# Patient Record
Sex: Male | Born: 1980
Health system: Southern US, Community
[De-identification: ages and names within clinical notes are randomized; demographics above are authoritative.]

## PROBLEM LIST (undated history)

## (undated) DIAGNOSIS — I10 Essential (primary) hypertension: Secondary | ICD-10-CM

## (undated) DIAGNOSIS — E119 Type 2 diabetes mellitus without complications: Secondary | ICD-10-CM

## (undated) HISTORY — DX: Essential (primary) hypertension: I10

## (undated) HISTORY — DX: Type 2 diabetes mellitus without complications: E11.9

---

## 2009-06-14 ENCOUNTER — Emergency Department: Payer: Self-pay | Admitting: Emergency Medicine

## 2010-06-10 ENCOUNTER — Emergency Department: Payer: Self-pay | Admitting: Emergency Medicine

## 2010-08-10 ENCOUNTER — Emergency Department: Payer: Self-pay | Admitting: Emergency Medicine

## 2012-01-08 IMAGING — CR DG ABDOMEN 1V
1 series · 1 of 1 positions shown · non-contrast
Comparison: none

REASON FOR EXAM: diarrhea / 2 days odorous belechimg
COMMENTS:

PROCEDURE:     DXR - DXR KIDNEY URETER BLADDER  - August 10, 2010  [DATE]
RESULT:     Comparisons:  None

[view not recorded]
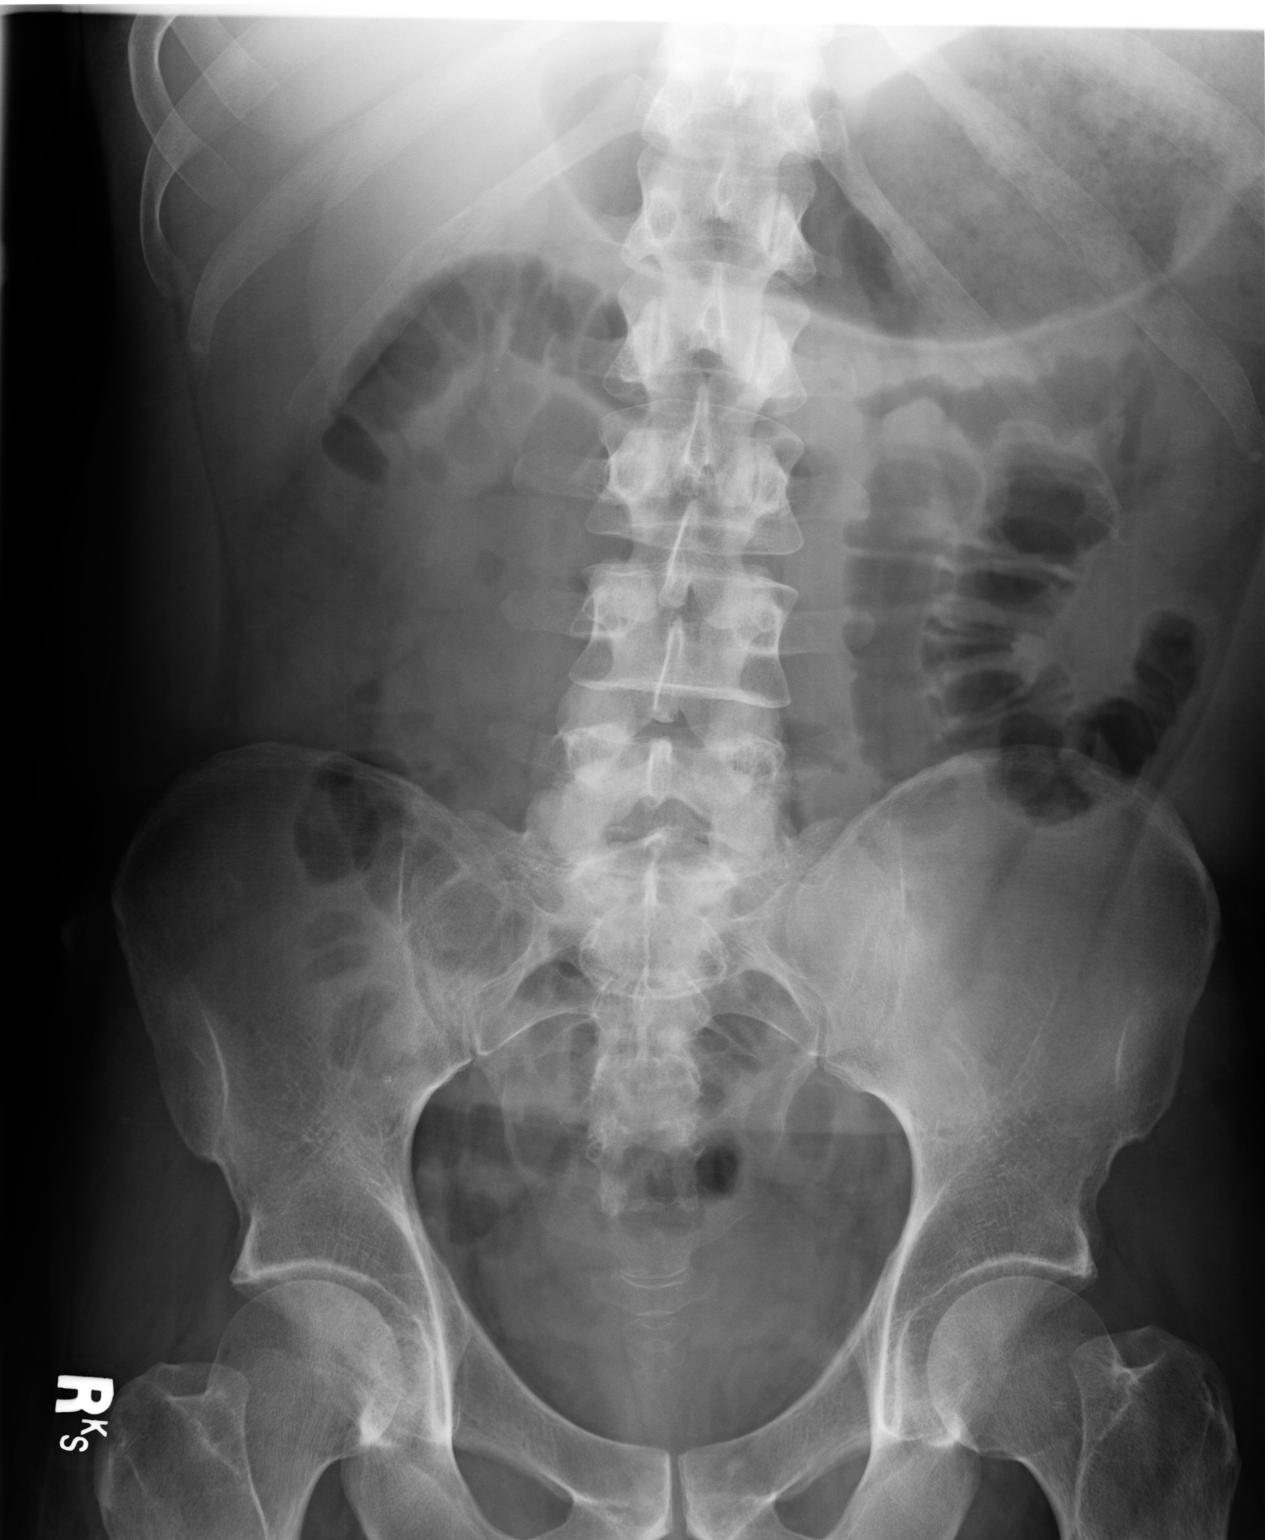

[1 of 1 positions shown; findings below may reference images not displayed]

FINDINGS: Supine radiograph of the abdomen is provided.

There is a nonspecific bowel gas pattern. There is gaseous distention of the
stomach. There is no bowel dilatation to suggest obstruction. There is no
pathologic calcification along the expected course of the ureters. There is
no evidence of pneumoperitoneum, portal venous gas, or pneumatosis.

The osseous structures are unremarkable.
IMPRESSION: Unremarkable KUB.

## 2014-04-12 ENCOUNTER — Emergency Department: Payer: Self-pay | Admitting: Emergency Medicine

## 2015-11-27 ENCOUNTER — Ambulatory Visit (INDEPENDENT_AMBULATORY_CARE_PROVIDER_SITE_OTHER): Payer: Managed Care, Other (non HMO) | Admitting: Internal Medicine

## 2015-11-27 ENCOUNTER — Encounter: Payer: Self-pay | Admitting: Internal Medicine

## 2015-11-27 VITALS — BP 120/78 | HR 76 | Temp 98.8°F | Ht 65.75 in | Wt 151.5 lb

## 2015-11-27 DIAGNOSIS — R140 Abdominal distension (gaseous): Secondary | ICD-10-CM

## 2015-11-27 DIAGNOSIS — R195 Other fecal abnormalities: Secondary | ICD-10-CM

## 2015-11-27 DIAGNOSIS — E119 Type 2 diabetes mellitus without complications: Secondary | ICD-10-CM | POA: Diagnosis not present

## 2015-11-27 NOTE — Patient Instructions (Signed)

## 2015-11-27 NOTE — Progress Notes (Signed)
HPI  Pt presents to the clinic today to establish care and for management of the conditions listed below. He has not had a PCP in many years.  DM 2: Diagnosed about 6 years ago. Initially started on Metformin but could not take it secondary to GI upset. Tried to maintain a low carb diet. Has not had A1C checked since that time. He denies blurred vision, increased thirst, urinary frequency, and numbness and tingling in the hands or feet.  He also c/o gassiness and loose stool. He reports this has been going on for a year. It seems worse right after he eats. He also reports excessive sweating with eating. He can not attribute this to certain foods. He takes Imodium as needed for this. He denies any family history of GI issues, IBS.  Flu: never Tetanus: > 10 years Dentist: as needed  Past Medical History:  Diagnosis Date  . Diabetes mellitus without complication (HCC)     No current outpatient prescriptions on file.   No current facility-administered medications for this visit.     No Known Allergies  Family History  Problem Relation Age of Onset  . Thyroid disease Mother   . Alcohol abuse Father   . Drug abuse Father   . Schizophrenia Father   . Depression Father     Social History   Social History  . Marital status: Single    Spouse name: N/A  . Number of children: N/A  . Years of education: N/A   Occupational History  . Not on file.   Social History Main Topics  . Smoking status: Never Smoker  . Smokeless tobacco: Never Used  . Alcohol use Yes     Comment: rare  . Drug use: No  . Sexual activity: Not on file   Other Topics Concern  . Not on file   Social History Narrative  . No narrative on file    ROS:  Constitutional: Denies fever, malaise, fatigue, headache or abrupt weight changes.  HEENT: Denies eye pain, eye redness, ear pain, ringing in the ears, wax buildup, runny nose, nasal congestion, bloody nose, or sore throat. Respiratory: Denies difficulty  breathing, shortness of breath, cough or sputum production.   Cardiovascular: Denies chest pain, chest tightness, palpitations or swelling in the hands or feet.  Gastrointestinal: Pt reports gassiness and loose stool. Denies abdominal pain, bloating, constipation, or blood in the stool.  GU: Denies frequency, urgency, pain with urination, blood in urine, odor or discharge. Musculoskeletal: Denies decrease in range of motion, difficulty with gait, muscle pain or joint pain and swelling.  Skin: Denies redness, rashes, lesions or ulcercations.  Neurological: Denies dizziness, difficulty with memory, difficulty with speech or problems with balance and coordination.  Psych: Denies anxiety, depression, SI/HI.  No other specific complaints in a complete review of systems (except as listed in HPI above).  PE:  BP 120/78   Pulse 76   Temp 98.8 F (37.1 C) (Oral)   Ht 5' 5.75" (1.67 m)   Wt 151 lb 8 oz (68.7 kg)   SpO2 98%   BMI 24.64 kg/m  Wt Readings from Last 3 Encounters:  11/27/15 151 lb 8 oz (68.7 kg)    General: Appears his stated age,  in NAD. Neck: Neck supple, trachea midline. No masses, lumps or thyromegaly present.  Cardiovascular: Normal rate and rhythm. S1,S2 noted.  No murmur, rubs or gallops noted.  Pulmonary/Chest: Normal effort and positive vesicular breath sounds. No respiratory distress. No wheezes, rales or  ronchi noted.  Abdomen: Soft and nontender. Normal bowel sounds. No distention or masses noted.  Neurological: Alert and oriented.  Psychiatric: Mood and affect normal. Behavior is normal. Judgment and thought content normal.     Assessment and Plan:  Gassiness and loose stool:  ? IBS vs food allergy Given family history of hyperthyroid, will check TSH CBC, CMET and H Pylori as well  Make an appt for your annual exam Nicki Reaper, NP

## 2015-11-28 LAB — CBC
HCT: 40.2 % (ref 39.0–52.0)
Hemoglobin: 13.9 g/dL (ref 13.0–17.0)
MCHC: 34.7 g/dL (ref 30.0–36.0)
MCV: 87.6 fl (ref 78.0–100.0)
Platelets: 218 10*3/uL (ref 150.0–400.0)
RBC: 4.59 Mil/uL (ref 4.22–5.81)
RDW: 12.7 % (ref 11.5–15.5)
WBC: 6.9 10*3/uL (ref 4.0–10.5)

## 2015-11-28 LAB — COMPREHENSIVE METABOLIC PANEL
ALBUMIN: 4.2 g/dL (ref 3.5–5.2)
ALK PHOS: 55 U/L (ref 39–117)
ALT: 21 U/L (ref 0–53)
AST: 16 U/L (ref 0–37)
BILIRUBIN TOTAL: 0.5 mg/dL (ref 0.2–1.2)
BUN: 15 mg/dL (ref 6–23)
CO2: 31 mEq/L (ref 19–32)
CREATININE: 0.84 mg/dL (ref 0.40–1.50)
Calcium: 9.4 mg/dL (ref 8.4–10.5)
Chloride: 103 mEq/L (ref 96–112)
GFR: 109.04 mL/min (ref >60.00)
GLUCOSE: 240 mg/dL — AB (ref 70–99)
POTASSIUM: 4.7 meq/L (ref 3.5–5.1)
SODIUM: 140 meq/L (ref 135–145)
TOTAL PROTEIN: 6.6 g/dL (ref 6.0–8.3)

## 2015-11-28 LAB — TSH: TSH: 1.52 u[IU]/mL (ref 0.35–4.50)

## 2015-11-28 LAB — HEMOGLOBIN A1C: Hgb A1c MFr Bld: 9.2 % — ABNORMAL HIGH (ref 4.6–6.5)

## 2015-11-28 LAB — H. PYLORI ANTIBODY, IGG: H Pylori IgG: NEGATIVE

## 2015-11-29 ENCOUNTER — Telehealth: Payer: Self-pay | Admitting: Internal Medicine

## 2015-11-29 MED ORDER — GLIPIZIDE 10 MG PO TABS: 10.0000 mg | ORAL_TABLET | Freq: Two times a day (BID) | ORAL | 3 refills | Status: DC

## 2015-11-29 NOTE — Telephone Encounter (Signed)
Sent in glipizide per lab note

## 2015-11-29 NOTE — Telephone Encounter (Signed)
See labs 

## 2015-11-29 NOTE — Telephone Encounter (Signed)
Patient called asking to get his lab results.

## 2015-11-29 NOTE — Addendum Note (Signed)
Addended by: Eual FinesBRIDGES, SHANNON P on: 11/29/2015 05:22 PM   Modules accepted: Orders

## 2015-12-06 ENCOUNTER — Ambulatory Visit (INDEPENDENT_AMBULATORY_CARE_PROVIDER_SITE_OTHER): Payer: Managed Care, Other (non HMO) | Admitting: Internal Medicine

## 2015-12-06 ENCOUNTER — Encounter: Payer: Self-pay | Admitting: Internal Medicine

## 2015-12-06 ENCOUNTER — Encounter: Payer: Managed Care, Other (non HMO) | Admitting: Internal Medicine

## 2015-12-06 ENCOUNTER — Encounter: Payer: Self-pay | Admitting: *Deleted

## 2015-12-06 VITALS — BP 122/70 | HR 95 | Temp 98.6°F | Ht 65.5 in | Wt 146.5 lb

## 2015-12-06 DIAGNOSIS — Z0000 Encounter for general adult medical examination without abnormal findings: Secondary | ICD-10-CM

## 2015-12-06 DIAGNOSIS — Z114 Encounter for screening for human immunodeficiency virus [HIV]: Secondary | ICD-10-CM

## 2015-12-06 DIAGNOSIS — R143 Flatulence: Secondary | ICD-10-CM | POA: Diagnosis not present

## 2015-12-06 DIAGNOSIS — IMO0001 Reserved for inherently not codable concepts without codable children: Secondary | ICD-10-CM

## 2015-12-06 DIAGNOSIS — Z23 Encounter for immunization: Secondary | ICD-10-CM | POA: Diagnosis not present

## 2015-12-06 DIAGNOSIS — R195 Other fecal abnormalities: Secondary | ICD-10-CM

## 2015-12-06 DIAGNOSIS — R140 Abdominal distension (gaseous): Secondary | ICD-10-CM | POA: Diagnosis not present

## 2015-12-06 DIAGNOSIS — Z7689 Persons encountering health services in other specified circumstances: Secondary | ICD-10-CM

## 2015-12-06 LAB — LIPID PANEL
CHOL/HDL RATIO: 5
Cholesterol: 140 mg/dL (ref 0–200)
HDL: 30.8 mg/dL — AB (ref >39.00)
LDL CALC: 95 mg/dL (ref 0–99)
NonHDL: 109.01
TRIGLYCERIDES: 72 mg/dL (ref 0.0–149.0)
VLDL: 14.4 mg/dL (ref 0.0–40.0)

## 2015-12-06 NOTE — Progress Notes (Signed)
Pre visit review using our clinic review tool, if applicable. No additional management support is needed unless otherwise documented below in the visit note. 

## 2015-12-06 NOTE — Patient Instructions (Signed)

## 2015-12-06 NOTE — Addendum Note (Signed)
Addended by: Shon MilletWATLINGTON, Rozlynn Lippold M on: 12/06/2015 02:26 PM   Modules accepted: Orders

## 2015-12-06 NOTE — Progress Notes (Signed)
Subjective:    Patient ID: Thomas Stephenson, male    DOB: [birth year], 35 y.o.   MRN: [id number]  HPI  Pt presents to the clinic today for his annual exam.  Flu: never Tetanus: > 10 years ago Dentist: as needed  Diet: He does eat meat. He consumes fruits and veggies daily, fried food occasionally. He does drink soda and water. Exercise: None  Review of Systems    Past Medical History:  Diagnosis Date  . Diabetes mellitus without complication (HCC)     Current Outpatient Prescriptions  Medication Sig Dispense Refill  . glipiZIDE (GLUCOTROL) 10 MG tablet Take 1 tablet (10 mg total) by mouth 2 (two) times daily before a meal. 60 tablet 3   No current facility-administered medications for this visit.     No Known Allergies  Family History  Problem Relation Age of Onset  . Thyroid disease Mother   . Alcohol abuse Father   . Drug abuse Father   . Schizophrenia Father   . Depression Father     Social History   Social History  . Marital status: Single    Spouse name: N/A  . Number of children: N/A  . Years of education: N/A   Occupational History  . Not on file.   Social History Main Topics  . Smoking status: Never Smoker  . Smokeless tobacco: Never Used  . Alcohol use Yes     Comment: rare  . Drug use: No  . Sexual activity: Yes   Other Topics Concern  . Not on file   Social History Narrative  . No narrative on file     Constitutional: Denies fever, malaise, fatigue, headache or abrupt weight changes.  HEENT: Denies eye pain, eye redness, ear pain, ringing in the ears, wax buildup, runny nose, nasal congestion, bloody nose, or sore throat. Respiratory: Denies difficulty breathing, shortness of breath, cough or sputum production.   Cardiovascular: Denies chest pain, chest tightness, palpitations or swelling in the hands or feet.  Gastrointestinal: Pt reports abdominal bloating and loose stools. Denies abdominal pain, constipation, or blood in the stool.    GU: Denies urgency, frequency, pain with urination, burning sensation, blood in urine, odor or discharge. Musculoskeletal: Denies decrease in range of motion, difficulty with gait, muscle pain or joint pain and swelling.  Skin: Denies redness, rashes, lesions or ulcercations.  Neurological: Denies dizziness, difficulty with memory, difficulty with speech or problems with balance and coordination.  Psych: Denies anxiety, depression, SI/HI.  No other specific complaints in a complete review of systems (except as listed in HPI above).      Objective:   Physical Exam  BP 122/70 (BP Location: Right Arm, Patient Position: Sitting, Cuff Size: Normal)   Pulse 95   Temp 98.6 F (37 C) (Oral)   Ht 5' 5.5" (1.664 m)   Wt 146 lb 8 oz (66.5 kg)   SpO2 97%   BMI 24.01 kg/m  Wt Readings from Last 3 Encounters:  12/06/15 146 lb 8 oz (66.5 kg)  11/27/15 151 lb 8 oz (68.7 kg)    General: Appears his stated age, well developed, well nourished in NAD. Skin: Warm, dry and intact.  HEENT: Head: normal shape and size; Eyes: sclera white, no icterus, conjunctiva pink, PERRLA and EOMs intact; Ears: Tm's gray and intact, normal light reflex; Throat/Mouth: Teeth present, mucosa pink and moist, no exudate, lesions or ulcerations noted.  Neck:  Neck supple, trachea midline. No masses, lumps or thyromegaly present.  Cardiovascular:  Normal rate and rhythm. S1,S2 noted.  No murmur, rubs or gallops noted. No JVD or BLE edema.  Pulmonary/Chest: Normal effort and positive vesicular breath sounds. No respiratory distress. No wheezes, rales or ronchi noted.  Abdomen: Soft and nontender. Normal bowel sounds. No distention or masses noted. Liver, spleen and kidneys non palpable. Musculoskeletal: Normal range of motion. Strength 5/5 BUE/BLE. No difficulty with gait.  Neurological: Alert and oriented. Cranial nerves II-XII grossly intact. Coordination normal.  Psychiatric: Mood and affect normal. Behavior is normal.  Judgment and thought content normal.     BMET    Component Value Date/Time   NA 140 11/27/2015 1537   K 4.7 11/27/2015 1537   CL 103 11/27/2015 1537   CO2 31 11/27/2015 1537   GLUCOSE 240 (H) 11/27/2015 1537   BUN 15 11/27/2015 1537   CREATININE 0.84 11/27/2015 1537   CALCIUM 9.4 11/27/2015 1537    Lipid Panel  No results found for: CHOL, TRIG, HDL, CHOLHDL, VLDL, LDLCALC  CBC    Component Value Date/Time   WBC 6.9 11/27/2015 1537   RBC 4.59 11/27/2015 1537   HGB 13.9 11/27/2015 1537   HCT 40.2 11/27/2015 1537   PLT 218.0 11/27/2015 1537   MCV 87.6 11/27/2015 1537   MCHC 34.7 11/27/2015 1537   RDW 12.7 11/27/2015 1537    Hgb A1C Lab Results  Component Value Date   HGBA1C 9.2 (H) 11/27/2015            Assessment & Plan:   Preventative Health Maintenance:  Flu shot today He declines tetanus Encouraged him to consume a balanced diet and exercise regimen Advised him to see a dentist annually Will check lipid profile, microalbumin and HIV today  Abdominal bloating, gas, loose stool:  Start a probiotic daily Referral to GI for further evaluation  RTC in 3 months to follow up diabetes Nicki Reaper, NP

## 2015-12-07 ENCOUNTER — Encounter: Payer: Self-pay | Admitting: *Deleted

## 2015-12-07 LAB — HIV ANTIBODY (ROUTINE TESTING W REFLEX): HIV: NONREACTIVE

## 2015-12-07 LAB — MICROALBUMIN, URINE: Microalb, Ur: 1.3 mg/dL

## 2015-12-10 ENCOUNTER — Telehealth: Payer: Self-pay | Admitting: *Deleted

## 2015-12-10 DIAGNOSIS — E1165 Type 2 diabetes mellitus with hyperglycemia: Secondary | ICD-10-CM

## 2015-12-10 NOTE — Telephone Encounter (Signed)
Patient also wants a script sent to the pharmacy for a glucose meter and supplies to check his blood sugar. These are not on his medication list with directions.  [facility]

## 2015-12-10 NOTE — Telephone Encounter (Signed)
PTs wife brought in a form to be filled out. Please fill out the form and call when it is ready [phone]. Form placed in prescription tower.

## 2015-12-11 MED ORDER — GLUCOSE BLOOD VI STRP: 1.0000 | ORAL_STRIP | Freq: Two times a day (BID) | 11 refills | Status: DC

## 2015-12-11 MED ORDER — ONETOUCH VERIO W/DEVICE KIT: 1.0000 | PACK | Freq: Once | 0 refills | Status: AC

## 2015-12-11 MED ORDER — BD ULTRA-FINE LANCETS MISC: 1.0000 | Freq: Two times a day (BID) | 11 refills | Status: DC

## 2015-12-11 NOTE — Telephone Encounter (Signed)
Mel, Can you send in meter, strips and lancets?

## 2015-12-11 NOTE — Telephone Encounter (Signed)
Do you have the form?

## 2015-12-11 NOTE — Telephone Encounter (Signed)
Done, placed in MYD inbox

## 2015-12-11 NOTE — Addendum Note (Signed)
Addended by: Roena MaladyEVONTENNO, Keilyn Nadal Y on: 12/11/2015 10:51 AM   Modules accepted: Orders

## 2015-12-11 NOTE — Telephone Encounter (Signed)
Form placed in your box.

## 2015-12-11 NOTE — Telephone Encounter (Signed)
I do not have the form

## 2015-12-12 ENCOUNTER — Telehealth: Payer: Self-pay | Admitting: Internal Medicine

## 2015-12-12 NOTE — Telephone Encounter (Signed)
Pt is aware form is ready for pick up---placed in front office and copy sent to scan

## 2015-12-12 NOTE — Telephone Encounter (Signed)
I spoke to pt earlier and he is aware form is ready for pick up

## 2015-12-12 NOTE — Telephone Encounter (Signed)
Thomas Stephenson called - she is asking about paperwork for work., she states that he needs it asap.  cb number is [phone]

## 2015-12-13 ENCOUNTER — Encounter: Payer: Self-pay | Admitting: Gastroenterology

## 2016-02-18 ENCOUNTER — Other Ambulatory Visit (INDEPENDENT_AMBULATORY_CARE_PROVIDER_SITE_OTHER): Payer: Managed Care, Other (non HMO)

## 2016-02-18 ENCOUNTER — Encounter: Payer: Self-pay | Admitting: Gastroenterology

## 2016-02-18 ENCOUNTER — Ambulatory Visit (INDEPENDENT_AMBULATORY_CARE_PROVIDER_SITE_OTHER): Payer: Managed Care, Other (non HMO) | Admitting: Gastroenterology

## 2016-02-18 ENCOUNTER — Encounter (INDEPENDENT_AMBULATORY_CARE_PROVIDER_SITE_OTHER): Payer: Self-pay

## 2016-02-18 VITALS — BP 112/60 | HR 76 | Ht 66.0 in | Wt 153.1 lb

## 2016-02-18 DIAGNOSIS — R194 Change in bowel habit: Secondary | ICD-10-CM

## 2016-02-18 DIAGNOSIS — R197 Diarrhea, unspecified: Secondary | ICD-10-CM | POA: Diagnosis not present

## 2016-02-18 LAB — IGA: IGA: 496 mg/dL — AB (ref 68–378)

## 2016-02-18 NOTE — Patient Instructions (Signed)
If you are age 37 or older, your body mass index should be between 23-30. Your Body mass index is 24.72 kg/m. If this is out of the aforementioned range listed, please consider follow up with your Primary Care Provider.  If you are age 37 or younger, your body mass index should be between 19-25. Your Body mass index is 24.72 kg/m. If this is out of the aformentioned range listed, please consider follow up with your Primary Care Provider.   Your physician has requested that you go to the basement for the following lab work before leaving today:  IGA, TTG  Please contact Dr Adela LankArmbruster if symptoms return.  Thank you.

## 2016-02-18 NOTE — Progress Notes (Signed)
HPI :  35 y/o male with a history of diabetes here for a new patient visit.  He was diagnosed with DM about 8 years ago, around 2009. He was on metformin initially which caused severe diarrhea / incontinence and he then stopped taking it due to these symptoms. He stopped metformin and tried to control his diabetes with diet / lifestyle changes which did not work. He was then placed on glipizide for A1c of 9.2. He reported initially when placed on this and had some recurrence of symptoms of severe diarrhea with nocturnal symptoms. However over time he has gotten better. He tried a probiotic and added fiber supplement which did not help too much initially. He was taking upwards of 10 immodium per day to help slow him down and had significant loose stools which impaired his work. For the past month and half he thinks his bowels have significantly improved.   He reports for the past 6 weeks he has around 2-3 BMs per week, and not having daily bowel movements and feeling better. He reports it had previously bothered him for roughly 4-5 months or so - during this time he was having variable stool frequency, anywhere upwards of 8-9 BMs per day of liquid stools. He was having nocturnal symptoms as well which bothered him. He denied any blood in the stools over this time. He has been off metformin for a long time.   He denies any NSAIDs routinely.   No FH of CRC, NO FH of known celiac or IBD.   TSH is normal   Past Medical History:  Diagnosis Date  . Diabetes mellitus without complication (HCC)      History reviewed. No pertinent surgical history. Family History  Problem Relation Age of Onset  . Thyroid disease Mother   . Alcohol abuse Father   . Drug abuse Father   . Schizophrenia Father   . Depression Father   . Colon cancer Neg Hx   . Stomach cancer Neg Hx    Social History  Substance Use Topics  . Smoking status: Never Smoker  . Smokeless tobacco: Never Used  . Alcohol use Yes   Comment: rare   Current Outpatient Prescriptions  Medication Sig Dispense Refill  . BD ULTRA-FINE LANCETS lancets 1 each by Other route 2 (two) times daily. Use as instructed 100 each 11  . glipiZIDE (GLUCOTROL) 10 MG tablet Take 1 tablet (10 mg total) by mouth 2 (two) times daily before a meal. 60 tablet 3  . glucose blood (ONETOUCH VERIO) test strip 1 each by Other route 2 (two) times daily. Use as instructed 100 each 11   No current facility-administered medications for this visit.    Allergies  Allergen Reactions  . Mushroom Extract Complex      Review of Systems: All systems reviewed and negative except where noted in HPI.   Lab Results  Component Value Date   WBC 6.9 11/27/2015   HGB 13.9 11/27/2015   HCT 40.2 11/27/2015   MCV 87.6 11/27/2015   PLT 218.0 11/27/2015    Lab Results  Component Value Date   CREATININE 0.84 11/27/2015   BUN 15 11/27/2015   NA 140 11/27/2015   K 4.7 11/27/2015   CL 103 11/27/2015   CO2 31 11/27/2015    Lab Results  Component Value Date   ALT 21 11/27/2015   AST 16 11/27/2015   ALKPHOS 55 11/27/2015   BILITOT 0.5 11/27/2015     Physical Exam: BP 112/60  Pulse 76   Ht 5\' 6"  (1.676 m)   Wt 153 lb 2 oz (69.5 kg)   BMI 24.72 kg/m  Constitutional: Pleasant,well-developed, male in no acute distress. HEENT: Normocephalic and atraumatic. Conjunctivae are normal. No scleral icterus. Neck supple.  Cardiovascular: Normal rate, regular rhythm.  Pulmonary/chest: Effort normal and breath sounds normal. No wheezing, rales or rhonchi. Abdominal: Soft, nondistended, nontender.  There are no masses palpable. No hepatomegaly. Extremities: no edema Lymphadenopathy: No cervical adenopathy noted. Neurological: Alert and oriented to person place and time. Skin: Skin is warm and dry. No rashes noted. Psychiatric: Normal mood and affect. Behavior is normal.   ASSESSMENT AND PLAN: 35 year old thin male with history of diabetes, here for  evaluation of bowel habit changes. History as above initially severe symptoms with metformin which resolved with stopping this. Symptoms recurred with starting glipizide and he reportedly had severe diarrhea with nocturnal symptoms. However he has continued this medication and has had no symptoms for the past 6 weeks or so.   From the dramatic symptoms he reports, if his symptoms recur like they did before I would have a low threshold to perform colonoscopy to rule out microscopic colitis/IBD, although his most recent labs showed no anemia or leukocytosis. He is rather thin and young to have type 2 diabetes, will screen for celiac disease given this history. Otherwise given he denies any symptoms currently we'll monitor for now. I asked him to contact me if symptoms recur so we can coordinate colonoscopy and further assessment. He agreed  Ileene PatrickSteven Maire Govan, MD [facility] Pager [phone]  CC: Lorre MunroeBaity, Regina W, NP

## 2016-02-19 LAB — TISSUE TRANSGLUTAMINASE, IGA: TISSUE TRANSGLUTAMINASE AB, IGA: 1 U/mL (ref <4)

## 2016-02-20 ENCOUNTER — Encounter: Payer: Self-pay | Admitting: Gastroenterology

## 2016-02-20 NOTE — Progress Notes (Signed)
Letter mailed

## 2016-02-26 ENCOUNTER — Other Ambulatory Visit: Payer: Self-pay | Admitting: Internal Medicine

## 2016-02-26 DIAGNOSIS — E119 Type 2 diabetes mellitus without complications: Secondary | ICD-10-CM

## 2016-02-27 ENCOUNTER — Other Ambulatory Visit: Payer: Managed Care, Other (non HMO)

## 2016-03-21 ENCOUNTER — Other Ambulatory Visit (INDEPENDENT_AMBULATORY_CARE_PROVIDER_SITE_OTHER): Payer: Commercial Managed Care - PPO

## 2016-03-21 DIAGNOSIS — E119 Type 2 diabetes mellitus without complications: Secondary | ICD-10-CM

## 2016-03-21 LAB — HEMOGLOBIN A1C
Hgb A1c MFr Bld: 6.7 % — ABNORMAL HIGH (ref <5.7)
Mean Plasma Glucose: 146 mg/dL

## 2016-03-21 NOTE — Addendum Note (Signed)
Addended by: Baldomero LamyHAVERS, NATASHA C on: 03/21/2016 04:04 PM   Modules accepted: Orders

## 2016-03-21 NOTE — Addendum Note (Signed)
Addended by: Baldomero LamyHAVERS, NATASHA C on: 03/21/2016 04:05 PM   Modules accepted: Orders

## 2016-03-29 ENCOUNTER — Other Ambulatory Visit: Payer: Self-pay | Admitting: Internal Medicine

## 2016-03-31 ENCOUNTER — Other Ambulatory Visit: Payer: Self-pay | Admitting: *Deleted

## 2016-03-31 MED ORDER — GLIPIZIDE 10 MG PO TABS: 10.0000 mg | ORAL_TABLET | Freq: Two times a day (BID) | ORAL | 0 refills | Status: DC

## 2016-03-31 NOTE — Telephone Encounter (Signed)
Medication sent electronically 

## 2016-03-31 NOTE — Telephone Encounter (Signed)
Patient left a voicemail stating that he is changing his pharmacy [address] [facility], [address] [place] and would like a 90 day supply sent to the pharmacy for his Glipizide. Patient cancelled his last appointment Okay to send as requested?

## 2016-06-16 ENCOUNTER — Other Ambulatory Visit: Payer: Self-pay | Admitting: Internal Medicine

## 2016-09-25 ENCOUNTER — Other Ambulatory Visit: Payer: Self-pay

## 2016-09-25 MED ORDER — GLIPIZIDE 10 MG PO TABS: ORAL_TABLET | ORAL | 0 refills | Status: DC

## 2016-12-17 ENCOUNTER — Other Ambulatory Visit: Payer: Self-pay | Admitting: Internal Medicine

## 2016-12-18 NOTE — Telephone Encounter (Signed)
Electronic refill request. Glipizide Last office visit:   02/18/16   Lab appt scheduled tomorrow (12/19/16) No upcoming appts scheduled. Last Filled:    180 tablet 0 09/25/2016  Please advise.

## 2016-12-19 ENCOUNTER — Other Ambulatory Visit: Payer: Commercial Managed Care - PPO

## 2016-12-19 MED ORDER — GLIPIZIDE 10 MG PO TABS: ORAL_TABLET | ORAL | 0 refills | Status: DC

## 2016-12-19 NOTE — Telephone Encounter (Signed)
He needs an appt to be seen, no a lab appt

## 2016-12-19 NOTE — Telephone Encounter (Signed)
Left message on voicemail pt needs CPE no labs will be ordered... 1 refill sent to pharmacy and no additional refills will be sent without office visit

## 2016-12-19 NOTE — Addendum Note (Signed)
Addended by: Roena Malady on: 12/19/2016 02:15 PM   Modules accepted: Orders

## 2016-12-20 ENCOUNTER — Other Ambulatory Visit: Payer: Self-pay | Admitting: Internal Medicine

## 2017-03-18 ENCOUNTER — Other Ambulatory Visit: Payer: Self-pay | Admitting: Internal Medicine

## 2017-03-18 ENCOUNTER — Telehealth: Payer: Self-pay | Admitting: Internal Medicine

## 2017-03-18 NOTE — Telephone Encounter (Signed)
I have refilled medication. Pt needs to schedule an annual exam not labs.

## 2017-03-18 NOTE — Telephone Encounter (Signed)
msg sent to pt, refill has already been sent to pharmacy and pt must schedule annual exam before any refills can be sent in

## 2017-03-18 NOTE — Telephone Encounter (Signed)
Copied from CRM [id number]. Topic: Appointment Scheduling - Scheduling Inquiry for Clinic >> Mar 18, 2017 12:09 PM Guinevere FerrariMorris, Jayvion Stefanski E, NT wrote: Reason for CRM: Pt called in and wanted to  come in and get his A1C checked to get a refill forglipiZIDE (GLUCOTROL) 10 MG tablet.but didn't see any orders. Pt said it has to be checked before refill can be approved.Pt has only 3 pills left and would like a call back for scheduling. [facility] uses [id number] [place], [place] - [address]

## 2017-03-31 ENCOUNTER — Ambulatory Visit (INDEPENDENT_AMBULATORY_CARE_PROVIDER_SITE_OTHER): Payer: Commercial Managed Care - PPO | Admitting: Internal Medicine

## 2017-03-31 ENCOUNTER — Encounter: Payer: Self-pay | Admitting: Internal Medicine

## 2017-03-31 VITALS — BP 120/80 | HR 66 | Temp 98.9°F | Ht 66.0 in | Wt 160.0 lb

## 2017-03-31 DIAGNOSIS — Z0000 Encounter for general adult medical examination without abnormal findings: Secondary | ICD-10-CM

## 2017-03-31 DIAGNOSIS — E119 Type 2 diabetes mellitus without complications: Secondary | ICD-10-CM | POA: Diagnosis not present

## 2017-03-31 DIAGNOSIS — Z23 Encounter for immunization: Secondary | ICD-10-CM | POA: Diagnosis not present

## 2017-03-31 LAB — LIPID PANEL
Cholesterol: 135 mg/dL (ref 0–200)
HDL: 27.5 mg/dL — AB (ref >39.00)
LDL CALC: 82 mg/dL (ref 0–99)
NONHDL: 107.56
Total CHOL/HDL Ratio: 5
Triglycerides: 130 mg/dL (ref 0.0–149.0)
VLDL: 26 mg/dL (ref 0.0–40.0)

## 2017-03-31 LAB — CBC
HCT: 42.2 % (ref 39.0–52.0)
HEMOGLOBIN: 14.3 g/dL (ref 13.0–17.0)
MCHC: 33.9 g/dL (ref 30.0–36.0)
MCV: 89 fl (ref 78.0–100.0)
Platelets: 228 10*3/uL (ref 150.0–400.0)
RBC: 4.75 Mil/uL (ref 4.22–5.81)
RDW: 12.6 % (ref 11.5–15.5)
WBC: 6.8 10*3/uL (ref 4.0–10.5)

## 2017-03-31 LAB — MICROALBUMIN / CREATININE URINE RATIO
Creatinine,U: 232.7 mg/dL
Microalb Creat Ratio: 1.3 mg/g (ref 0.0–30.0)
Microalb, Ur: 3 mg/dL — ABNORMAL HIGH (ref 0.0–1.9)

## 2017-03-31 LAB — HEMOGLOBIN A1C: Hgb A1c MFr Bld: 8.1 % — ABNORMAL HIGH (ref 4.6–6.5)

## 2017-03-31 LAB — COMPREHENSIVE METABOLIC PANEL
ALT: 17 U/L (ref 0–53)
AST: 14 U/L (ref 0–37)
Albumin: 4.3 g/dL (ref 3.5–5.2)
Alkaline Phosphatase: 51 U/L (ref 39–117)
BUN: 15 mg/dL (ref 6–23)
CO2: 31 meq/L (ref 19–32)
Calcium: 9.4 mg/dL (ref 8.4–10.5)
Chloride: 100 mEq/L (ref 96–112)
Creatinine, Ser: 0.75 mg/dL (ref 0.40–1.50)
GFR: 123.39 mL/min (ref >60.00)
GLUCOSE: 213 mg/dL — AB (ref 70–99)
POTASSIUM: 4.3 meq/L (ref 3.5–5.1)
SODIUM: 136 meq/L (ref 135–145)
Total Bilirubin: 0.7 mg/dL (ref 0.2–1.2)
Total Protein: 6.8 g/dL (ref 6.0–8.3)

## 2017-03-31 NOTE — Patient Instructions (Signed)

## 2017-03-31 NOTE — Assessment & Plan Note (Signed)
A1C and microalbumin today Encouraged him to consume a low carb diet and exercise for weight loss Foot exam today Encouraged yearly eye exams Continue Glipizide for now Flu and pneumovax today

## 2017-03-31 NOTE — Addendum Note (Signed)
Addended by: Roena MaladyEVONTENNO, Williom Cedar Y on: 03/31/2017 12:54 PM   Modules accepted: Orders

## 2017-03-31 NOTE — Progress Notes (Signed)
Subjective:    Patient ID: Thomas Stephenson, male    DOB: 1980-03-26, 37 y.o.   MRN: [id number]  HPI  Pt presents to the clinic today for his annual exam. He is also due to follow up DM 2.  DM 2: His last A1C was 6.7, 03/2016. He is taking Glipizide 10 mg BID. He is not monitoring is sugars. He tries to consume a low carb diet. He does not check his feet. His last eye exam was. Flu 12/2015. Pneumovax never.  Flu: 12/2015 Pneumovax: never Tetanus: > 10 years ago Dentist: as needed  Diet: He does eat meat. He consumes more fruits than veggies. He rarely eats fried foods. He drinks mostly diet soda. Exercise: None  Review of Systems      Past Medical History:  Diagnosis Date  . Diabetes mellitus without complication (HCC)     Current Outpatient Medications  Medication Sig Dispense Refill  . BD ULTRA-FINE LANCETS lancets 1 each by Other route 2 (two) times daily. Use as instructed 100 each 11  . glipiZIDE (GLUCOTROL) 10 MG tablet Take 1 tablet twice daily before meals MUST SCHEDULE ANNUAL PHYSICAL 60 tablet 0  . glucose blood (ONETOUCH VERIO) test strip 1 each by Other route 2 (two) times daily. Use as instructed 100 each 11   No current facility-administered medications for this visit.     Allergies  Allergen Reactions  . Mushroom Extract Complex     Family History  Problem Relation Age of Onset  . Thyroid disease Mother   . Alcohol abuse Father   . Drug abuse Father   . Schizophrenia Father   . Depression Father   . Colon cancer Neg Hx   . Stomach cancer Neg Hx     Social History   Socioeconomic History  . Marital status: Single    Spouse name: Not on file  . Number of children: Not on file  . Years of education: Not on file  . Highest education level: Not on file  Social Needs  . Financial resource strain: Not on file  . Food insecurity - worry: Not on file  . Food insecurity - inability: Not on file  . Transportation needs - medical: Not on file  .  Transportation needs - non-medical: Not on file  Occupational History  . Not on file  Tobacco Use  . Smoking status: Never Smoker  . Smokeless tobacco: Never Used  Substance and Sexual Activity  . Alcohol use: Yes    Comment: rare  . Drug use: No  . Sexual activity: Yes  Other Topics Concern  . Not on file  Social History Narrative  . Not on file     Constitutional: Denies fever, malaise, fatigue, headache or abrupt weight changes.  HEENT: Denies eye pain, eye redness, ear pain, ringing in the ears, wax buildup, runny nose, nasal congestion, bloody nose, or sore throat. Respiratory: Denies difficulty breathing, shortness of breath, cough or sputum production.   Cardiovascular: Denies chest pain, chest tightness, palpitations or swelling in the hands or feet.  Gastrointestinal: Denies abdominal pain, bloating, constipation, diarrhea or blood in the stool.  GU: Denies urgency, frequency, pain with urination, burning sensation, blood in urine, odor or discharge. Musculoskeletal: Denies decrease in range of motion, difficulty with gait, muscle pain or joint pain and swelling.  Skin: Denies redness, rashes, lesions or ulcercations.  Neurological: Denies dizziness, difficulty with memory, difficulty with speech or problems with balance and coordination.  Psych: Denies anxiety, depression,  SI/HI.  No other specific complaints in a complete review of systems (except as listed in HPI above).  Objective:   Physical Exam   BP 120/80   Pulse 66   Temp 98.9 F (37.2 C) (Oral)   Ht $RemoveBefor [id number]$5\' 6"25.82 kg/m  Wt Readings from Last 3 Encounters:  03/31/17 160 lb (72.6 kg)  02/18/16 153 lb 2 oz (69.5 kg)  12/06/15 146 lb 8 oz (66.5 kg)    General: Appears his stated age, well developed, well nourished in NAD. Skin: Warm, dry and intact. No ulcerations noted. HEENT: Head: normal shape and size; Eyes: sclera white, no icterus, conjunctiva pink,  PERRLA and EOMs intact; Ears: Tm's gray and intact, normal light reflex;  Throat/Mouth: Teeth present, mucosa pink and moist, no exudate, lesions or ulcerations noted.  Neck:  Neck supple, trachea midline. No masses, lumps or thyromegaly present.  Cardiovascular: Normal rate and rhythm. S1,S2 noted.  No murmur, rubs or gallops noted. No JVD or BLE edema.  Pulmonary/Chest: Normal effort and positive vesicular breath sounds. No respiratory distress. No wheezes, rales or ronchi noted.  Abdomen: Soft and nontender. Normal bowel sounds. No distention or masses noted. Liver, spleen and kidneys non palpable. Musculoskeletal: Strength 5/5 BUE/BLE. No difficulty with gait.  Neurological: Alert and oriented. Cranial nerves II-XII grossly intact. Coordination normal.  Psychiatric: Mood and affect normal. Behavior is normal. Judgment and thought content normal.    BMET    Component Value Date/Time   NA 140 11/27/2015 1537   K 4.7 11/27/2015 1537   CL 103 11/27/2015 1537   CO2 31 11/27/2015 1537   GLUCOSE 240 (H) 11/27/2015 1537   BUN 15 11/27/2015 1537   CREATININE 0.84 11/27/2015 1537   CALCIUM 9.4 11/27/2015 1537    Lipid Panel     Component Value Date/Time   CHOL 140 12/06/2015 1430   TRIG 72.0 12/06/2015 1430   HDL 30.80 (L) 12/06/2015 1430   CHOLHDL 5 12/06/2015 1430   VLDL 14.4 12/06/2015 1430   LDLCALC 95 12/06/2015 1430    CBC    Component Value Date/Time   WBC 6.9 11/27/2015 1537   RBC 4.59 11/27/2015 1537   HGB 13.9 11/27/2015 1537   HCT 40.2 11/27/2015 1537   PLT 218.0 11/27/2015 1537   MCV 87.6 11/27/2015 1537   MCHC 34.7 11/27/2015 1537   RDW 12.7 11/27/2015 1537    Hgb A1C Lab Results  Component Value Date   HGBA1C 6.7 (H) 03/21/2016            Assessment & Plan:   Preventative Health Maintenance:  Flu and pneumovax today He declines tetanus vaccine Encouraged him to consume a balanced diet and exercise regimen Advised him to see an eye doctor and  dentist annually Will check CBC, CMET, Lipid, A1C, Microalbumin today  RTC in 1 year, sooner if needed Nicki ReaperBAITY, REGINA, NP

## 2017-04-02 MED ORDER — SITAGLIPTIN PHOSPHATE 50 MG PO TABS: 50.0000 mg | ORAL_TABLET | Freq: Every day | ORAL | 3 refills | Status: DC

## 2017-04-02 MED ORDER — LISINOPRIL 5 MG PO TABS: 5.0000 mg | ORAL_TABLET | Freq: Every day | ORAL | 3 refills | Status: DC

## 2017-04-02 MED ORDER — GLIPIZIDE 10 MG PO TABS: ORAL_TABLET | ORAL | 3 refills | Status: DC

## 2017-04-02 NOTE — Addendum Note (Signed)
Addended by: Roena MaladyEVONTENNO, Jance Siek Y on: 04/02/2017 10:27 AM   Modules accepted: Orders

## 2017-04-09 ENCOUNTER — Ambulatory Visit: Payer: Self-pay | Admitting: *Deleted

## 2017-04-09 NOTE — Telephone Encounter (Signed)
I advised Darcel BayleyKatrese RN that from recent office note and lab results pt is to take glipizide and Januvia and lisinopril. Asencion GowdaKatrese said she would get info on lifestyle changes and then note would be sent to Pamala Hurry Baity NP to see if the lifestyle changes would change medication plan.

## 2017-04-09 NOTE — Telephone Encounter (Signed)
Pt called wanting to know if he should take [place]uv[place] along with his glipizide; results given per Thomas Stephenson dated 03/31/17, " A1C is up to 8.1%, not good. Add Januvia 50 mg daily #30, 2 refills. Diabetes is starting to affect kidneys. We need to start him on low dose Lisinopril 5 mg daily for renal protection #30, 2 refills. Blood counts are normal. Liver and Kidney function are normal. Cholesterol is normal. Follow up with me in 3 months."; pt also says that he has made lifestyle changes like drinking mostly water instead of diet sodas, eating salads and veggies for dinner and a high protein breakfast with eggs and cheese; additionally he would like 90 day refills on his glipizide if he has to continue taking it; [facility] [address] [place] [place]; spoke with Artelia Larocheena regarding; [facility] and Artelia LarocheRena will forward this to the provider; the pt can be contacted at [phone].

## 2017-04-09 NOTE — Telephone Encounter (Signed)
Continue Lisinopril, Glipizide and Januvia. Ok for 90 day supply of Glipizide. Follow up with me in 3 months. Continue to work on lifestyle changes. If he feels like sugars are getting too low, he should notify me.

## 2017-04-10 ENCOUNTER — Telehealth: Payer: Self-pay | Admitting: Internal Medicine

## 2017-04-10 NOTE — Telephone Encounter (Signed)
Copied from CRM [id number]. Topic: Quick Communication - See Telephone Encounter >> Apr 10, 2017  9:42 AM Floria RavelingStovall, Shana A wrote: CRM for notification. See Telephone encounter for: pt called in said the he was put on new meds sitaGLIPtin (JANUVIA) 50 MG tablet [[id number]][[id number]] .  This has shot his sugar way up.  His sugar was 220 just 5 mins ago   04/10/17.

## 2017-04-10 NOTE — Telephone Encounter (Signed)
Likely not from the medication. I want him to continue to take it, monitor sugars and let me know if they are persistently high.

## 2017-04-10 NOTE — Telephone Encounter (Signed)
Pt is aware as instructed and expressed understanding... Pt will call back with update if BG continues to be elevated

## 2017-06-16 ENCOUNTER — Other Ambulatory Visit: Payer: Self-pay

## 2017-06-16 MED ORDER — GLIPIZIDE 10 MG PO TABS: ORAL_TABLET | ORAL | 0 refills | Status: DC

## 2017-06-19 ENCOUNTER — Other Ambulatory Visit: Payer: Self-pay | Admitting: Internal Medicine

## 2017-06-19 MED ORDER — LISINOPRIL 5 MG PO TABS: 5.0000 mg | ORAL_TABLET | Freq: Every day | ORAL | 1 refills | Status: DC

## 2017-06-19 MED ORDER — SITAGLIPTIN PHOSPHATE 50 MG PO TABS: 50.0000 mg | ORAL_TABLET | Freq: Every day | ORAL | 1 refills | Status: DC

## 2017-06-19 NOTE — Addendum Note (Signed)
Addended by: Roena MaladyEVONTENNO, Marzella Miracle Y on: 06/19/2017 01:10 PM   Modules accepted: Orders

## 2017-06-26 ENCOUNTER — Other Ambulatory Visit: Payer: Self-pay

## 2017-06-26 MED ORDER — LISINOPRIL 5 MG PO TABS: 5.0000 mg | ORAL_TABLET | Freq: Every day | ORAL | 2 refills | Status: DC

## 2017-07-09 ENCOUNTER — Other Ambulatory Visit: Payer: Self-pay | Admitting: Internal Medicine

## 2017-07-09 ENCOUNTER — Encounter: Payer: Self-pay | Admitting: Internal Medicine

## 2017-07-09 ENCOUNTER — Ambulatory Visit (INDEPENDENT_AMBULATORY_CARE_PROVIDER_SITE_OTHER): Payer: Commercial Managed Care - PPO | Admitting: Internal Medicine

## 2017-07-09 VITALS — BP 116/80 | HR 80 | Temp 98.3°F | Ht 66.0 in | Wt 147.0 lb

## 2017-07-09 DIAGNOSIS — E119 Type 2 diabetes mellitus without complications: Secondary | ICD-10-CM | POA: Diagnosis not present

## 2017-07-09 LAB — POCT GLYCOSYLATED HEMOGLOBIN (HGB A1C): HEMOGLOBIN A1C: 4.9

## 2017-07-09 NOTE — Addendum Note (Signed)
Addended by: Wendi MayaGREESON, Eowyn Tabone on: 07/09/2017 02:48 PM   Modules accepted: Orders

## 2017-07-09 NOTE — Progress Notes (Signed)
Subjective:    Patient ID: Thomas Stephenson, male    DOB: 06-Nov-1980, 37 y.o.   MRN: [id number]  HPI  Pt presents to the clinic today to follow up DM 2. His last A1C was 8.1%. He is taking Glipizide as directed. Januvia and Lisinopril were added to his regimen. He reports his sugars range 49-245. He tries to consume a low carb diet but does not exercise routinely. He has lost 13 lbs in the last 3 months. His last eye exam was more than 1 year ago. He checks his feet routinely.  Review of Systems      Past Medical History:  Diagnosis Date  . Diabetes mellitus without complication (HCC)     Current Outpatient Medications  Medication Sig Dispense Refill  . APPLE CIDER VINEGAR PO Take 1 capsule by mouth 2 (two) times daily.    . BD ULTRA-FINE LANCETS lancets 1 each by Other route 2 (two) times daily. Use as instructed 100 each 11  . glipiZIDE (GLUCOTROL) 10 MG tablet Take 1 tablet twice daily before meals MUST SCHEDULE FOLLOW UP 180 tablet 0  . glucose blood (ONETOUCH VERIO) test strip 1 each by Other route 2 (two) times daily. Use as instructed 100 each 11  . lisinopril (PRINIVIL,ZESTRIL) 5 MG tablet Take 1 tablet (5 mg total) by mouth daily. 90 tablet 2  . sitaGLIPtin (JANUVIA) 50 MG tablet Take 1 tablet (50 mg total) by mouth daily. 30 tablet 1   No current facility-administered medications for this visit.     Allergies  Allergen Reactions  . Mushroom Extract Complex     Family History  Problem Relation Age of Onset  . Thyroid disease Mother   . Alcohol abuse Father   . Drug abuse Father   . Schizophrenia Father   . Depression Father   . Colon cancer Neg Hx   . Stomach cancer Neg Hx     Social History   Socioeconomic History  . Marital status: Married    Spouse name: Not on file  . Number of children: Not on file  . Years of education: Not on file  . Highest education level: Not on file  Occupational History  . Not on file  Social Needs  . Financial resource  strain: Not on file  . Food insecurity:    Worry: Not on file    Inability: Not on file  . Transportation needs:    Medical: Not on file    Non-medical: Not on file  Tobacco Use  . Smoking status: Never Smoker  . Smokeless tobacco: Never Used  Substance and Sexual Activity  . Alcohol use: Yes    Comment: rare  . Drug use: No  . Sexual activity: Yes  Lifestyle  . Physical activity:    Days per week: Not on file    Minutes per session: Not on file  . Stress: Not on file  Relationships  . Social connections:    Talks on phone: Not on file    Gets together: Not on file    Attends religious service: Not on file    Active member of club or organization: Not on file    Attends meetings of clubs or organizations: Not on file    Relationship status: Not on file  . Intimate partner violence:    Fear of current or ex partner: Not on file    Emotionally abused: Not on file    Physically abused: Not on file  Forced sexual activity: Not on file  Other Topics Concern  . Not on file  Social History Narrative  . Not on file     Constitutional: Denies fever, malaise, fatigue, headache or abrupt weight changes.  Respiratory: Denies difficulty breathing, shortness of breath, cough or sputum production.   Cardiovascular: Denies chest pain, chest tightness, palpitations or swelling in the hands or feet.  Skin: Denies redness, rashes, lesions or ulcercations.    No other specific complaints in a complete review of systems (except as listed in HPI above).  Objective:   Physical Exam  BP 116/80 (BP Location: Right Arm, Patient Position: Sitting, Cuff Size: Normal)   Pulse 80   Temp 98.3 F (36.8 C) (Oral)   Ht 5\' 6"  (1.676 m)   Wt 147 lb (66.7 kg)   SpO2 97%   BMI 23.73 kg/m  Wt Readings from Last 3 Encounters:  07/09/17 147 lb (66.7 kg)  03/31/17 160 lb (72.6 kg)  02/18/16 153 lb 2 oz (69.5 kg)    General: Appears his stated age, well developed, well nourished in  NAD. Skin: Warm, dry and intact. No ulcerations noted. Cardiovascular: Normal rate and rhythm. S1,S2 noted.  No murmur, rubs or gallops noted.  Pulmonary/Chest: Normal effort and positive vesicular breath sounds. No respiratory distress. No wheezes, rales or ronchi noted.  Neurological: Alert and oriented. Sensation intact to BLE.  BMET    Component Value Date/Time   NA 136 03/31/2017 1223   K 4.3 03/31/2017 1223   CL 100 03/31/2017 1223   CO2 31 03/31/2017 1223   GLUCOSE 213 (H) 03/31/2017 1223   BUN 15 03/31/2017 1223   CREATININE 0.75 03/31/2017 1223   CALCIUM 9.4 03/31/2017 1223    Lipid Panel     Component Value Date/Time   CHOL 135 03/31/2017 1223   TRIG 130.0 03/31/2017 1223   HDL 27.50 (L) 03/31/2017 1223   CHOLHDL 5 03/31/2017 1223   VLDL 26.0 03/31/2017 1223   LDLCALC 82 03/31/2017 1223    CBC    Component Value Date/Time   WBC 6.8 03/31/2017 1223   RBC 4.75 03/31/2017 1223   HGB 14.3 03/31/2017 1223   HCT 42.2 03/31/2017 1223   PLT 228.0 03/31/2017 1223   MCV 89.0 03/31/2017 1223   MCHC 33.9 03/31/2017 1223   RDW 12.6 03/31/2017 1223    Hgb A1C Lab Results  Component Value Date   HGBA1C 8.1 (H) 03/31/2017            Assessment & Plan:

## 2017-07-09 NOTE — Patient Instructions (Signed)

## 2017-07-09 NOTE — Assessment & Plan Note (Signed)
A1C today No need to repeat microalbumin now that he is on an ACEI Encouraged him to continue monitoring his sugars Encouraged routine exercise Continue Glipizide and Januvia for now May need to stop Januvia pending A1C Immunizations UTD

## 2017-07-18 ENCOUNTER — Other Ambulatory Visit: Payer: Self-pay | Admitting: Internal Medicine

## 2017-07-20 MED ORDER — GLIPIZIDE 5 MG PO TABS: 5.0000 mg | ORAL_TABLET | Freq: Two times a day (BID) | ORAL | 3 refills | Status: DC

## 2017-07-20 MED ORDER — GLIPIZIDE 10 MG PO TABS: ORAL_TABLET | ORAL | 0 refills | Status: DC

## 2017-07-20 NOTE — Telephone Encounter (Signed)
We will stay on Glipizide 10 mg BID. Please let pt know.

## 2017-08-31 LAB — HM DIABETES EYE EXAM

## 2017-09-18 ENCOUNTER — Other Ambulatory Visit: Payer: Self-pay | Admitting: Internal Medicine

## 2017-10-01 ENCOUNTER — Other Ambulatory Visit: Payer: Self-pay

## 2017-10-01 MED ORDER — GLIPIZIDE 10 MG PO TABS: ORAL_TABLET | ORAL | 0 refills | Status: DC

## 2017-10-09 ENCOUNTER — Ambulatory Visit (INDEPENDENT_AMBULATORY_CARE_PROVIDER_SITE_OTHER)
Admission: RE | Admit: 2017-10-09 | Discharge: 2017-10-09 | Disposition: A | Discharge status: Home / Self Care | Payer: Commercial Managed Care - PPO | Source: Ambulatory Visit | Attending: Internal Medicine | Admitting: Internal Medicine

## 2017-10-09 ENCOUNTER — Encounter: Payer: Self-pay | Admitting: Internal Medicine

## 2017-10-09 ENCOUNTER — Ambulatory Visit: Payer: Commercial Managed Care - PPO | Admitting: Internal Medicine

## 2017-10-09 VITALS — BP 108/66 | HR 77 | Temp 98.7°F | Wt 146.0 lb

## 2017-10-09 DIAGNOSIS — M25511 Pain in right shoulder: Secondary | ICD-10-CM | POA: Diagnosis not present

## 2017-10-09 DIAGNOSIS — E119 Type 2 diabetes mellitus without complications: Secondary | ICD-10-CM

## 2017-10-09 LAB — POCT GLYCOSYLATED HEMOGLOBIN (HGB A1C): Hemoglobin A1C: 5.5 % (ref 4.0–5.6)

## 2017-10-09 NOTE — Assessment & Plan Note (Signed)
POCT A1C 5.5 % Continue Glipizide Encouraged low carb diet Encouraged yearly eye exam Pneumovax UTD

## 2017-10-09 NOTE — Progress Notes (Signed)
Subjective:    Patient ID: Thomas Stephenson, male    DOB: 11-06-1980, 37 y.o.   MRN: [id number]  HPI  Pt presents to the clinic today for follow up of diabetes. His last A1C was 4.9%, 04/2017. His Januvia was stopped. He continued Glipizide as prescribed. He denies hypoglycemia. He checks his feet daily. He has seen an eye doctor within the last year.  He also c/o right shoulder pain. This started 2-3 months ago. He describes the pain as dull and achy, but can be sharp and shooting with certain movements. He denies numbness, tingling or weakness in the right arm. He denies any injury to the area. He has not tried anything OTC for his symptoms.  Review of Systems      Past Medical History:  Diagnosis Date  . Diabetes mellitus without complication (HCC)     Current Outpatient Medications  Medication Sig Dispense Refill  . BD ULTRA-FINE LANCETS lancets 1 each by Other route 2 (two) times daily. Use as instructed 100 each 11  . glipiZIDE (GLUCOTROL) 10 MG tablet Take 1 tablet twice daily before meals MUST SCHEDULE FOLLOW UP 180 tablet 0  . glucose blood (ONETOUCH VERIO) test strip 1 each by Other route 2 (two) times daily. Use as instructed 100 each 11  . lisinopril (PRINIVIL,ZESTRIL) 5 MG tablet Take 1 tablet (5 mg total) by mouth daily. 90 tablet 2   No current facility-administered medications for this visit.     Allergies  Allergen Reactions  . Mushroom Extract Complex     Family History  Problem Relation Age of Onset  . Thyroid disease Mother   . Alcohol abuse Father   . Drug abuse Father   . Schizophrenia Father   . Depression Father   . Colon cancer Neg Hx   . Stomach cancer Neg Hx     Social History   Socioeconomic History  . Marital status: Married    Spouse name: Not on file  . Number of children: Not on file  . Years of education: Not on file  . Highest education level: Not on file  Occupational History  . Not on file  Social Needs  . Financial resource  strain: Not on file  . Food insecurity:    Worry: Not on file    Inability: Not on file  . Transportation needs:    Medical: Not on file    Non-medical: Not on file  Tobacco Use  . Smoking status: Never Smoker  . Smokeless tobacco: Never Used  Substance and Sexual Activity  . Alcohol use: Yes    Comment: rare  . Drug use: No  . Sexual activity: Yes  Lifestyle  . Physical activity:    Days per week: Not on file    Minutes per session: Not on file  . Stress: Not on file  Relationships  . Social connections:    Talks on phone: Not on file    Gets together: Not on file    Attends religious service: Not on file    Active member of club or organization: Not on file    Attends meetings of clubs or organizations: Not on file    Relationship status: Not on file  . Intimate partner violence:    Fear of current or ex partner: Not on file    Emotionally abused: Not on file    Physically abused: Not on file    Forced sexual activity: Not on file  Other Topics Concern  .  Not on file  Social History Narrative  . Not on file     Constitutional: Denies fever, malaise, fatigue, headache or abrupt weight changes.  Respiratory: Denies difficulty breathing, shortness of breath, cough or sputum production.   Cardiovascular: Denies chest pain, chest tightness, palpitations or swelling in the hands or feet.  Musculoskeletal: Pt reports right shoulder pain. Denies decrease in range of motion, difficulty with gait, muscle pain or joint swelling.  Skin: Denies redness, rashes, lesions or ulcercations.    No other specific complaints in a complete review of systems (except as listed in HPI above).  Objective:   Physical Exam   BP 108/66   Pulse 77   Temp 98.7 F (37.1 C) (Oral)   Wt 146 lb (66.2 kg)   SpO2 97%   BMI 23.57 kg/m  Wt Readings from Last 3 Encounters:  10/09/17 146 lb (66.2 kg)  07/09/17 147 lb (66.7 kg)  03/31/17 160 lb (72.6 kg)    General: Appears his stated age,  well developed, well nourished in NAD. Skin: Warm, dry and intact. No ulcerations noted. Cardiovascular: Normal rate and rhythm. S1,S2 noted.  No murmur, rubs or gallops noted. Pulmonary/Chest: Normal effort and positive vesicular breath sounds. No respiratory distress. No wheezes, rales or ronchi noted.  Musculoskeletal: Normal internal and external rotation of the right shoulder. No pain with palpation of the right shoulder. No joint swelling noted. Negative drop can on the right. Strength 5/5 BUE. Neurological: Alert and oriented. Sensation intact to BLE.  BMET    Component Value Date/Time   NA 136 03/31/2017 1223   K 4.3 03/31/2017 1223   CL 100 03/31/2017 1223   CO2 31 03/31/2017 1223   GLUCOSE 213 (H) 03/31/2017 1223   BUN 15 03/31/2017 1223   CREATININE 0.75 03/31/2017 1223   CALCIUM 9.4 03/31/2017 1223    Lipid Panel     Component Value Date/Time   CHOL 135 03/31/2017 1223   TRIG 130.0 03/31/2017 1223   HDL 27.50 (L) 03/31/2017 1223   CHOLHDL 5 03/31/2017 1223   VLDL 26.0 03/31/2017 1223   LDLCALC 82 03/31/2017 1223    CBC    Component Value Date/Time   WBC 6.8 03/31/2017 1223   RBC 4.75 03/31/2017 1223   HGB 14.3 03/31/2017 1223   HCT 42.2 03/31/2017 1223   PLT 228.0 03/31/2017 1223   MCV 89.0 03/31/2017 1223   MCHC 33.9 03/31/2017 1223   RDW 12.6 03/31/2017 1223    Hgb A1C Lab Results  Component Value Date   HGBA1C 5.5 10/09/2017           Assessment & Plan:   Right Shoulder Pain:  Xray right shoulder today Ibuprofen 800 mg every 8 hours as needed- consume with food Ice may be helpful  Shoulder exercises given  Return precautions discussed Nicki Reaper, NP

## 2017-10-09 NOTE — Patient Instructions (Signed)
Shoulder Exercises Ask your health care provider which exercises are safe for you. Do exercises exactly as told by your health care provider and adjust them as directed. It is normal to feel mild stretching, pulling, tightness, or discomfort as you do these exercises, but you should stop right away if you feel sudden pain or your pain gets worse.Do not begin these exercises until told by your health care provider. RANGE OF MOTION EXERCISES These exercises warm up your muscles and joints and improve the movement and flexibility of your shoulder. These exercises also help to relieve pain, numbness, and tingling. These exercises involve stretching your injured shoulder directly. Exercise A: Pendulum  1. Stand near a wall or a surface that you can hold onto for balance. 2. Bend at the waist and let your left / right arm hang straight down. Use your other arm to support you. Keep your back straight and do not lock your knees. 3. Relax your left / right arm and shoulder muscles, and move your hips and your trunk so your left / right arm swings freely. Your arm should swing because of the motion of your body, not because you are using your arm or shoulder muscles. 4. Keep moving your body so your arm swings in the following directions, as told by your health care provider: ? Side to side. ? Forward and backward. ? In clockwise and counterclockwise circles. 5. Continue each motion for __________ seconds, or for as long as told by your health care provider. 6. Slowly return to the starting position. Repeat __________ times. Complete this exercise __________ times a day. Exercise B:Flexion, Standing  1. Stand and hold a broomstick, a cane, or a similar object. Place your hands a little more than shoulder-width apart on the object. Your left / right hand should be palm-up, and your other hand should be palm-down. 2. Keep your elbow straight and keep your shoulder muscles relaxed. Push the stick down with  your healthy arm to raise your left / right arm in front of your body, and then over your head until you feel a stretch in your shoulder. ? Avoid shrugging your shoulder while you raise your arm. Keep your shoulder blade tucked down toward the middle of your back. 3. Hold for __________ seconds. 4. Slowly return to the starting position. Repeat __________ times. Complete this exercise __________ times a day. Exercise C: Abduction, Standing 1. Stand and hold a broomstick, a cane, or a similar object. Place your hands a little more than shoulder-width apart on the object. Your left / right hand should be palm-up, and your other hand should be palm-down. 2. While keeping your elbow straight and your shoulder muscles relaxed, push the stick across your body toward your left / right side. Raise your left / right arm to the side of your body and then over your head until you feel a stretch in your shoulder. ? Do not raise your arm above shoulder height, unless your health care provider tells you to do that. ? Avoid shrugging your shoulder while you raise your arm. Keep your shoulder blade tucked down toward the middle of your back. 3. Hold for __________ seconds. 4. Slowly return to the starting position. Repeat __________ times. Complete this exercise __________ times a day. Exercise D:Internal Rotation  1. Place your left / right hand behind your back, palm-up. 2. Use your other hand to dangle an exercise band, a towel, or a similar object over your shoulder. Grasp the band with   your left / right hand so you are holding onto both ends. 3. Gently pull up on the band until you feel a stretch in the front of your left / right shoulder. ? Avoid shrugging your shoulder while you raise your arm. Keep your shoulder blade tucked down toward the middle of your back. 4. Hold for __________ seconds. 5. Release the stretch by letting go of the band and lowering your hands. Repeat __________ times. Complete  this exercise __________ times a day. STRETCHING EXERCISES These exercises warm up your muscles and joints and improve the movement and flexibility of your shoulder. These exercises also help to relieve pain, numbness, and tingling. These exercises are done using your healthy shoulder to help stretch the muscles of your injured shoulder. Exercise E: Corner Stretch (External Rotation and Abduction)  1. Stand in a doorway with one of your feet slightly in front of the other. This is called a staggered stance. If you cannot reach your forearms to the door frame, stand facing a corner of a room. 2. Choose one of the following positions as told by your health care provider: ? Place your hands and forearms on the door frame above your head. ? Place your hands and forearms on the door frame at the height of your head. ? Place your hands on the door frame at the height of your elbows. 3. Slowly move your weight onto your front foot until you feel a stretch across your chest and in the front of your shoulders. Keep your head and chest upright and keep your abdominal muscles tight. 4. Hold for __________ seconds. 5. To release the stretch, shift your weight to your back foot. Repeat __________ times. Complete this stretch __________ times a day. Exercise F:Extension, Standing 1. Stand and hold a broomstick, a cane, or a similar object behind your back. ? Your hands should be a little wider than shoulder-width apart. ? Your palms should face away from your back. 2. Keeping your elbows straight and keeping your shoulder muscles relaxed, move the stick away from your body until you feel a stretch in your shoulder. ? Avoid shrugging your shoulders while you move the stick. Keep your shoulder blade tucked down toward the middle of your back. 3. Hold for __________ seconds. 4. Slowly return to the starting position. Repeat __________ times. Complete this exercise __________ times a day. STRENGTHENING  EXERCISES These exercises build strength and endurance in your shoulder. Endurance is the ability to use your muscles for a long time, even after they get tired. Exercise G:External Rotation  1. Sit in a stable chair without armrests. 2. Secure an exercise band at elbow height on your left / right side. 3. Place a soft object, such as a folded towel or a small pillow, between your left / right upper arm and your body to move your elbow a few inches away (about 10 cm) from your side. 4. Hold the end of the band so it is tight and there is no slack. 5. Keeping your elbow pressed against the soft object, move your left / right forearm out, away from your abdomen. Keep your body steady so only your forearm moves. 6. Hold for __________ seconds. 7. Slowly return to the starting position. Repeat __________ times. Complete this exercise __________ times a day. Exercise H:Shoulder Abduction  1. Sit in a stable chair without armrests, or stand. 2. Hold a __________ weight in your left / right hand, or hold an exercise band with both hands.   3. Start with your arms straight down and your left / right palm facing in, toward your body. 4. Slowly lift your left / right hand out to your side. Do not lift your hand above shoulder height unless your health care provider tells you that this is safe. ? Keep your arms straight. ? Avoid shrugging your shoulder while you do this movement. Keep your shoulder blade tucked down toward the middle of your back. 5. Hold for __________ seconds. 6. Slowly lower your arm, and return to the starting position. Repeat __________ times. Complete this exercise __________ times a day. Exercise I:Shoulder Extension 1. Sit in a stable chair without armrests, or stand. 2. Secure an exercise band to a stable object in front of you where it is at shoulder height. 3. Hold one end of the exercise band in each hand. Your palms should face each other. 4. Straighten your elbows and  lift your hands up to shoulder height. 5. Step back, away from the secured end of the exercise band, until the band is tight and there is no slack. 6. Squeeze your shoulder blades together as you pull your hands down to the sides of your thighs. Stop when your hands are straight down by your sides. Do not let your hands go behind your body. 7. Hold for __________ seconds. 8. Slowly return to the starting position. Repeat __________ times. Complete this exercise __________ times a day. Exercise J:Standing Shoulder Row 1. Sit in a stable chair without armrests, or stand. 2. Secure an exercise band to a stable object in front of you so it is at waist height. 3. Hold one end of the exercise band in each hand. Your palms should be in a thumbs-up position. 4. Bend each of your elbows to an "L" shape (about 90 degrees) and keep your upper arms at your sides. 5. Step back until the band is tight and there is no slack. 6. Slowly pull your elbows back behind you. 7. Hold for __________ seconds. 8. Slowly return to the starting position. Repeat __________ times. Complete this exercise __________ times a day. Exercise K:Shoulder Press-Ups  1. Sit in a stable chair that has armrests. Sit upright, with your feet flat on the floor. 2. Put your hands on the armrests so your elbows are bent and your fingers are pointing forward. Your hands should be about even with the sides of your body. 3. Push down on the armrests and use your arms to lift yourself off of the chair. Straighten your elbows and lift yourself up as much as you comfortably can. ? Move your shoulder blades down, and avoid letting your shoulders move up toward your ears. ? Keep your feet on the ground. As you get stronger, your feet should support less of your body weight as you lift yourself up. 4. Hold for __________ seconds. 5. Slowly lower yourself back into the chair. Repeat __________ times. Complete this exercise __________ times a  day. Exercise L: Wall Push-Ups  1. Stand so you are facing a stable wall. Your feet should be about one arm-length away from the wall. 2. Lean forward and place your palms on the wall at shoulder height. 3. Keep your feet flat on the floor as you bend your elbows and lean forward toward the wall. 4. Hold for __________ seconds. 5. Straighten your elbows to push yourself back to the starting position. Repeat __________ times. Complete this exercise __________ times a day. This information is not intended to replace advice   given to you by your health care provider. Make sure you discuss any questions you have with your health care provider. Document Released: 01/15/2005 Document Revised: 11/26/2015 Document Reviewed: 11/12/2014 Elsevier Interactive Patient Education  2018 [employer] Inc[employer]  

## 2018-02-03 ENCOUNTER — Other Ambulatory Visit: Payer: Self-pay | Admitting: Internal Medicine

## 2018-03-17 DIAGNOSIS — E119 Type 2 diabetes mellitus without complications: Secondary | ICD-10-CM | POA: Diagnosis not present

## 2018-03-30 ENCOUNTER — Other Ambulatory Visit: Payer: Self-pay | Admitting: Internal Medicine

## 2018-04-09 ENCOUNTER — Other Ambulatory Visit: Payer: Self-pay | Admitting: Internal Medicine

## 2018-04-09 ENCOUNTER — Ambulatory Visit: Payer: Commercial Managed Care - PPO | Admitting: Internal Medicine

## 2018-04-09 ENCOUNTER — Encounter: Payer: Self-pay | Admitting: Internal Medicine

## 2018-04-09 VITALS — BP 110/72 | HR 66 | Temp 98.1°F | Wt 157.0 lb

## 2018-04-09 DIAGNOSIS — Z23 Encounter for immunization: Secondary | ICD-10-CM

## 2018-04-09 DIAGNOSIS — E119 Type 2 diabetes mellitus without complications: Secondary | ICD-10-CM | POA: Diagnosis not present

## 2018-04-09 LAB — POCT GLYCOSYLATED HEMOGLOBIN (HGB A1C): Hemoglobin A1C: 6 % — AB (ref 4.0–5.6)

## 2018-04-09 MED ORDER — EXENATIDE ER 2 MG ~~LOC~~ PEN
2.0000 mg | PEN_INJECTOR | SUBCUTANEOUS | 2 refills | Status: DC
Start: 2018-04-09 — End: 2018-04-13

## 2018-04-09 NOTE — Progress Notes (Signed)
Subjective:    Patient ID: Thomas Stephenson, male    DOB: 06-29-1980, 38 y.o.   MRN: [id number]  HPI  Pt presents to the clinic today for follow up of DM 2. His last A1C was 5.5%, 09/2017. He is taking Glipizide as prescribed. His sugars range 75- 200. He is on Lisinopril for renal protection. His last eye exam was 05/2017, + retinopathy ([facility] [place]). Pneumovax UTD. Flu 03/2017.  Review of Systems      Past Medical History:  Diagnosis Date  . Diabetes mellitus without complication (HCC)     Current Outpatient Medications  Medication Sig Dispense Refill  . BD ULTRA-FINE LANCETS lancets 1 each by Other route 2 (two) times daily. Use as instructed 100 each 11  . glipiZIDE (GLUCOTROL) 10 MG tablet TAKE 1 TABLET TWICE DAILY BEFORE MEALS *SCHEDULE ANNUAL EXAM Jan 2020* 180 tablet 0  . glucose blood (ONETOUCH VERIO) test strip 1 each by Other route 2 (two) times daily. Use as instructed 100 each 11  . lisinopril (PRINIVIL,ZESTRIL) 5 MG tablet Take 1 tablet (5 mg total) by mouth daily. MUST SCHEDULE ANNUAL EXAM 90 tablet 0   No current facility-administered medications for this visit.     Allergies  Allergen Reactions  . Mushroom Extract Complex     Family History  Problem Relation Age of Onset  . Thyroid disease Mother   . Alcohol abuse Father   . Drug abuse Father   . Schizophrenia Father   . Depression Father   . Colon cancer Neg Hx   . Stomach cancer Neg Hx     Social History   Socioeconomic History  . Marital status: Married    Spouse name: Not on file  . Number of children: Not on file  . Years of education: Not on file  . Highest education level: Not on file  Occupational History  . Not on file  Social Needs  . Financial resource strain: Not on file  . Food insecurity:    Worry: Not on file    Inability: Not on file  . Transportation needs:    Medical: Not on file    Non-medical: Not on file  Tobacco Use  . Smoking  status: Never Smoker  . Smokeless tobacco: Never Used  Substance and Sexual Activity  . Alcohol use: Yes    Comment: rare  . Drug use: No  . Sexual activity: Yes  Lifestyle  . Physical activity:    Days per week: Not on file    Minutes per session: Not on file  . Stress: Not on file  Relationships  . Social connections:    Talks on phone: Not on file    Gets together: Not on file    Attends religious service: Not on file    Active member of club or organization: Not on file    Attends meetings of clubs or organizations: Not on file    Relationship status: Not on file  . Intimate partner violence:    Fear of current or ex partner: Not on file    Emotionally abused: Not on file    Physically abused: Not on file    Forced sexual activity: Not on file  Other Topics Concern  . Not on file  Social History Narrative  . Not on file     Constitutional: Denies fever, malaise, fatigue, headache or abrupt weight changes.  Respiratory: Denies difficulty breathing, shortness of breath, cough or sputum production.  Cardiovascular: Denies chest pain, chest tightness, palpitations or swelling in the hands or feet.  Gastrointestinal: Denies abdominal pain, bloating, constipation, diarrhea or blood in the stool.  GU: Denies urgency, frequency, pain with urination, burning sensation, blood in urine, odor or discharge. Skin: Denies redness, rashes, lesions or ulcercations.  Neurological: Denies numbness or tingling of BLE.    No other specific complaints in a complete review of systems (except as listed in HPI above).  Objective:   Physical Exam   BP 110/72   Pulse 66   Temp 98.1 F (36.7 C) (Oral)   Wt 157 lb (71.2 kg)   SpO2 98%   BMI 25.34 kg/m  Wt Readings from Last 3 Encounters:  04/09/18 157 lb (71.2 kg)  10/09/17 146 lb (66.2 kg)  07/09/17 147 lb (66.7 kg)    General: Appears his stated age, well developed, well nourished in NAD. Skin: Warm, dry and intact. No   ulcerations noted. Cardiovascular: Normal rate and rhythm. S1,S2 noted.  No murmur, rubs or gallops noted. No JVD or BLE edema.  Pulmonary/Chest: Normal effort and positive vesicular breath sounds. No respiratory distress. No wheezes, rales or ronchi noted.  Neurological: Alert and oriented. Sensation intact to BLE.   BMET    Component Value Date/Time   NA 136 03/31/2017 1223   K 4.3 03/31/2017 1223   CL 100 03/31/2017 1223   CO2 31 03/31/2017 1223   GLUCOSE 213 (H) 03/31/2017 1223   BUN 15 03/31/2017 1223   CREATININE 0.75 03/31/2017 1223   CALCIUM 9.4 03/31/2017 1223    Lipid Panel     Component Value Date/Time   CHOL 135 03/31/2017 1223   TRIG 130.0 03/31/2017 1223   HDL 27.50 (L) 03/31/2017 1223   CHOLHDL 5 03/31/2017 1223   VLDL 26.0 03/31/2017 1223   LDLCALC 82 03/31/2017 1223    CBC    Component Value Date/Time   WBC 6.8 03/31/2017 1223   RBC 4.75 03/31/2017 1223   HGB 14.3 03/31/2017 1223   HCT 42.2 03/31/2017 1223   PLT 228.0 03/31/2017 1223   MCV 89.0 03/31/2017 1223   MCHC 33.9 03/31/2017 1223   RDW 12.6 03/31/2017 1223    Hgb A1C Lab Results  Component Value Date   HGBA1C 6.0 (A) 04/09/2018           Assessment & Plan:

## 2018-04-09 NOTE — Assessment & Plan Note (Signed)
A1C 6 No microalbumin secondary to ACEI therapy Encouraged him to consume a low carb diet He wants to transition from Glipizide to [employer] and needles sent to pharmacy Discussed how, when and where to inject He will continue to monitor sugars Foot exam today Continue yearly eye exams Pneumovax UTD Flu shot today

## 2018-04-09 NOTE — Patient Instructions (Signed)
Diabetes Basics    Diabetes (diabetes mellitus) is a long-term (chronic) disease. It occurs when the body does not properly use sugar (glucose) that is released from food after you eat.  Diabetes may be caused by one or both of these problems:  · Your pancreas does not make enough of a hormone called insulin.  · Your body does not react in a normal way to insulin that it makes.  Insulin lets sugars (glucose) go into cells in your body. This gives you energy. If you have diabetes, sugars cannot get into cells. This causes high blood sugar (hyperglycemia).  Follow these instructions at home:  How is diabetes treated?  You may need to take insulin or other diabetes medicines daily to keep your blood sugar in balance. Take your diabetes medicines every day as told by your doctor. List your diabetes medicines here:  Diabetes medicines  · Name of medicine: ______________________________  ? Amount (dose): _______________ Time (a.m./p.m.): _______________ Notes: ___________________________________  · Name of medicine: ______________________________  ? Amount (dose): _______________ Time (a.m./p.m.): _______________ Notes: ___________________________________  · Name of medicine: ______________________________  ? Amount (dose): _______________ Time (a.m./p.m.): _______________ Notes: ___________________________________  If you use insulin, you will learn how to give yourself insulin by injection. You may need to adjust the amount based on the food that you eat. List the types of insulin you use here:  Insulin  · Insulin type: ______________________________  ? Amount (dose): _______________ Time (a.m./p.m.): _______________ Notes: ___________________________________  · Insulin type: ______________________________  ? Amount (dose): _______________ Time (a.m./p.m.): _______________ Notes: ___________________________________  · Insulin type: ______________________________  ? Amount (dose): _______________ Time (a.m./p.m.):  _______________ Notes: ___________________________________  · Insulin type: ______________________________  ? Amount (dose): _______________ Time (a.m./p.m.): _______________ Notes: ___________________________________  · Insulin type: ______________________________  ? Amount (dose): _______________ Time (a.m./p.m.): _______________ Notes: ___________________________________  How do I manage my blood sugar?    Check your blood sugar levels using a blood glucose monitor as directed by your doctor.  Your doctor will set treatment goals for you. Generally, you should have these blood sugar levels:  · Before meals (preprandial): 80-130 mg/dL (4.4-7.2 mmol/L).  · After meals (postprandial): below 180 mg/dL (10 mmol/L).  · A1c level: less than 7%.  Write down the times that you will check your blood sugar levels:  Blood sugar checks  · Time: _______________ Notes: ___________________________________  · Time: _______________ Notes: ___________________________________  · Time: _______________ Notes: ___________________________________  · Time: _______________ Notes: ___________________________________  · Time: _______________ Notes: ___________________________________  · Time: _______________ Notes: ___________________________________    What do I need to know about low blood sugar?  Low blood sugar is called hypoglycemia. This is when blood sugar is at or below 70 mg/dL (3.9 mmol/L). Symptoms may include:  · Feeling:  ? Hungry.  ? Worried or nervous (anxious).  ? Sweaty and clammy.  ? Confused.  ? Dizzy.  ? Sleepy.  ? Sick to your stomach (nauseous).  · Having:  ? A fast heartbeat.  ? A headache.  ? A change in your vision.  ? Tingling or no feeling (numbness) around the mouth, lips, or tongue.  ? Jerky movements that you cannot control (seizure).  · Having trouble with:  ? Moving (coordination).  ? Sleeping.  ? Passing out (fainting).  ? Getting upset easily (irritability).  Treating low blood sugar  To treat low blood  sugar, eat or drink something sugary right away. If you can think clearly and swallow safely, follow the 15:15   rule:  · Take 15 grams of a fast-acting carb (carbohydrate). Talk with your doctor about how much you should take.  · Some fast-acting carbs are:  ? Sugar tablets (glucose pills). Take 3-4 glucose pills.  ? 6-8 pieces of hard candy.  ? 4-6 oz (120-150 mL) of fruit juice.  ? 4-6 oz (120-150 mL) of regular (not diet) soda.  ? 1 Tbsp (15 mL) honey or sugar.  · Check your blood sugar 15 minutes after you take the carb.  · If your blood sugar is still at or below 70 mg/dL (3.9 mmol/L), take 15 grams of a carb again.  · If your blood sugar does not go above 70 mg/dL (3.9 mmol/L) after 3 tries, get help right away.  · After your blood sugar goes back to normal, eat a meal or a snack within 1 hour.  Treating very low blood sugar  If your blood sugar is at or below 54 mg/dL (3 mmol/L), you have very low blood sugar (severe hypoglycemia). This is an emergency. Do not wait to see if the symptoms will go away. Get medical help right away. Call your local emergency services (911 in the [place] Do not drive yourself to the hospital.  Questions to ask your health care provider  · Do I need to meet with a diabetes educator?  · What equipment will I need to care for myself at home?  · What diabetes medicines do I need? When should I take them?  · How often do I need to check my blood sugar?  · What number can I call if I have questions?  · When is my next doctor's visit?  · Where can I find a support group for people with diabetes?  Where to find more information  · American Diabetes Association: www.diabetes.org  · American Association of Diabetes Educators: www.diabeteseducator.org/patient-resources  Contact a doctor if:  · Your blood sugar is at or above 240 mg/dL (13.3 mmol/L) for 2 days in a row.  · You have been sick or have had a fever for 2 days or more, and you are not getting better.  · You have any of these  problems for more than 6 hours:  ? You cannot eat or drink.  ? You feel sick to your stomach (nauseous).  ? You throw up (vomit).  ? You have watery poop (diarrhea).  Get help right away if:  · Your blood sugar is lower than 54 mg/dL (3 mmol/L).  · You get confused.  · You have trouble:  ? Thinking clearly.  ? Breathing.  Summary  · Diabetes (diabetes mellitus) is a long-term (chronic) disease. It occurs when the body does not properly use sugar (glucose) that is released from food after digestion.  · Take insulin and diabetes medicines as told.  · Check your blood sugar every day, as often as told.  · Keep all follow-up visits as told by your doctor. This is important.  This information is not intended to replace advice given to you by your health care provider. Make sure you discuss any questions you have with your health care provider.  Document Released: 06/05/2017 Document Revised: 08/24/2017 Document Reviewed: 06/05/2017  Elsevier Interactive Patient Education © 2019 [employer] Inc.

## 2018-04-12 NOTE — Telephone Encounter (Signed)
Pt calling to say pharmacy said the Bydureon not covered by ins and would need for R Baity NP to switch to a different medication; [facility] has sent over request for ozempic. Please advise. Pt request cb when resolved.(Bydureon is mentioned in 04/09/18 office note but Exenatide is on med list).

## 2018-04-13 NOTE — Telephone Encounter (Signed)
Pt is aware.  

## 2018-04-17 DIAGNOSIS — E119 Type 2 diabetes mellitus without complications: Secondary | ICD-10-CM | POA: Diagnosis not present

## 2018-05-12 ENCOUNTER — Other Ambulatory Visit: Payer: Self-pay | Admitting: Internal Medicine

## 2018-05-12 NOTE — Telephone Encounter (Signed)
I know there was a mention of stopping this medication and using Bydereon... please advise

## 2018-05-16 DIAGNOSIS — E119 Type 2 diabetes mellitus without complications: Secondary | ICD-10-CM | POA: Diagnosis not present

## 2018-06-16 DIAGNOSIS — E119 Type 2 diabetes mellitus without complications: Secondary | ICD-10-CM | POA: Diagnosis not present

## 2018-07-03 ENCOUNTER — Other Ambulatory Visit: Payer: Self-pay | Admitting: Internal Medicine

## 2018-07-13 ENCOUNTER — Encounter: Payer: Commercial Managed Care - PPO | Admitting: Internal Medicine

## 2018-08-03 ENCOUNTER — Other Ambulatory Visit: Payer: Self-pay | Admitting: Internal Medicine

## 2018-08-03 MED ORDER — GLIPIZIDE 10 MG PO TABS: 10.0000 mg | ORAL_TABLET | Freq: Two times a day (BID) | ORAL | 0 refills | Status: DC

## 2018-10-01 ENCOUNTER — Other Ambulatory Visit: Payer: Self-pay | Admitting: Internal Medicine

## 2018-10-01 MED ORDER — LISINOPRIL 5 MG PO TABS: 5.0000 mg | ORAL_TABLET | Freq: Every day | ORAL | 0 refills | Status: DC

## 2018-10-01 NOTE — Telephone Encounter (Signed)
Patient needs to schedule a CPE. This was cancelled in April. Then we can refill medication. Left message for patient to call back

## 2018-10-01 NOTE — Telephone Encounter (Signed)
Patient returned call.  Could not schedule CPE due to not knowing when he would be off work   Stated he believed he would have time in sept but could not schedule at this time

## 2018-10-01 NOTE — Telephone Encounter (Signed)
Will let provider review to ok refill, see note below. Thank you

## 2018-11-01 ENCOUNTER — Other Ambulatory Visit: Payer: Self-pay | Admitting: Internal Medicine

## 2018-11-07 ENCOUNTER — Other Ambulatory Visit: Payer: Self-pay | Admitting: Internal Medicine

## 2018-11-08 LAB — HM DIABETES EYE EXAM

## 2018-11-09 MED ORDER — GLIPIZIDE 10 MG PO TABS: 10.0000 mg | ORAL_TABLET | Freq: Two times a day (BID) | ORAL | 0 refills | Status: DC

## 2018-12-20 ENCOUNTER — Encounter: Payer: Commercial Managed Care - PPO | Admitting: Internal Medicine

## 2018-12-29 ENCOUNTER — Other Ambulatory Visit: Payer: Self-pay | Admitting: Internal Medicine

## 2019-01-05 ENCOUNTER — Other Ambulatory Visit: Payer: Self-pay | Admitting: Internal Medicine

## 2019-01-06 ENCOUNTER — Other Ambulatory Visit: Payer: Self-pay | Admitting: Internal Medicine

## 2019-01-06 MED ORDER — LISINOPRIL 5 MG PO TABS: 5.0000 mg | ORAL_TABLET | Freq: Every day | ORAL | 0 refills | Status: DC

## 2019-01-20 ENCOUNTER — Ambulatory Visit (INDEPENDENT_AMBULATORY_CARE_PROVIDER_SITE_OTHER): Payer: Commercial Managed Care - PPO | Admitting: Internal Medicine

## 2019-01-20 ENCOUNTER — Encounter: Payer: Self-pay | Admitting: Internal Medicine

## 2019-01-20 ENCOUNTER — Other Ambulatory Visit: Payer: Self-pay

## 2019-01-20 VITALS — BP 120/76 | HR 105 | Temp 98.7°F | Ht 66.0 in | Wt 158.0 lb

## 2019-01-20 DIAGNOSIS — Z0000 Encounter for general adult medical examination without abnormal findings: Secondary | ICD-10-CM

## 2019-01-20 DIAGNOSIS — E119 Type 2 diabetes mellitus without complications: Secondary | ICD-10-CM | POA: Diagnosis not present

## 2019-01-20 NOTE — Patient Instructions (Signed)

## 2019-01-20 NOTE — Assessment & Plan Note (Signed)
A1C today No urine microalbumin secondary to ACEI therapy Encouraged him to consume a low carb diet Foot exam today Encouraged yearly eye exam Continue Glipizide and Lisinopril

## 2019-01-20 NOTE — Progress Notes (Signed)
Subjective:    Patient ID: Thomas Stephenson, male    DOB: 1981-01-26, 38 y.o.   MRN: [id number]  HPI  Pt presents to the clinic today for his annual exam.   DM 2: His last A1C was 6%, 03/2018. He is taking Glipizide as prescribed. He does not monitor his sugars. He checks his feet routinely. His last eye exam was 09/2018. He is still taking Lisinopril 5mg  for renal protection.   Flu: 03/2018 Tetanus: > 10 years Pneumovax: 03/2018 Vision Screening: 2020 Dentist: as needed  Diet: He does eat meat. He consumes more veggies than fruits. He doesn't eat fried foods. He drinks mostly water, diet soda. Exercise: None  Review of Systems      Past Medical History:  Diagnosis Date  . Diabetes mellitus without complication (HCC)     Current Outpatient Medications  Medication Sig Dispense Refill  . BD ULTRA-FINE LANCETS lancets 1 each by Other route 2 (two) times daily. Use as instructed 100 each 11  . glipiZIDE (GLUCOTROL) 10 MG tablet Take 1 tablet (10 mg total) by mouth 2 (two) times daily before a meal. 180 tablet 0  . glucose blood (ONETOUCH VERIO) test strip 1 each by Other route 2 (two) times daily. Use as instructed 100 each 11  . lisinopril (ZESTRIL) 5 MG tablet Take 1 tablet (5 mg total) by mouth daily. 90 tablet 0   No current facility-administered medications for this visit.     Allergies  Allergen Reactions  . Mushroom Extract Complex     Family History  Problem Relation Age of Onset  . Thyroid disease Mother   . Alcohol abuse Father   . Drug abuse Father   . Schizophrenia Father   . Depression Father   . Colon cancer Neg Hx   . Stomach cancer Neg Hx     Social History   Socioeconomic History  . Marital status: Married    Spouse name: Not on file  . Number of children: Not on file  . Years of education: Not on file  . Highest education level: Not on file  Occupational History  . Not on file  Social Needs  . Financial resource strain: Not on file  . Food  insecurity    Worry: Not on file    Inability: Not on file  . Transportation needs    Medical: Not on file    Non-medical: Not on file  Tobacco Use  . Smoking status: Never Smoker  . Smokeless tobacco: Never Used  Substance and Sexual Activity  . Alcohol use: Yes    Comment: rare  . Drug use: No  . Sexual activity: Yes  Lifestyle  . Physical activity    Days per week: Not on file    Minutes per session: Not on file  . Stress: Not on file  Relationships  . Social 2021 on phone: Not on file    Gets together: Not on file    Attends religious service: Not on file    Active member of club or organization: Not on file    Attends meetings of clubs or organizations: Not on file    Relationship status: Not on file  . Intimate partner violence    Fear of current or ex partner: Not on file    Emotionally abused: Not on file    Physically abused: Not on file    Forced sexual activity: Not on file  Other Topics Concern  . Not  on file  Social History Narrative  . Not on file     Constitutional: Denies fever, malaise, fatigue, headache or abrupt weight changes.  HEENT: Denies eye pain, eye redness, ear pain, ringing in the ears, wax buildup, runny nose, nasal congestion, bloody nose, or sore throat. Respiratory: Denies difficulty breathing, shortness of breath, cough or sputum production.   Cardiovascular: Denies chest pain, chest tightness, palpitations or swelling in the hands or feet.  Gastrointestinal: Denies abdominal pain, bloating, constipation, diarrhea or blood in the stool.  GU: Denies urgency, frequency, pain with urination, burning sensation, blood in urine, odor or discharge. Musculoskeletal: Denies decrease in range of motion, difficulty with gait, muscle pain or joint pain and swelling.  Skin: Denies redness, rashes, lesions or ulcercations.  Neurological: Denies dizziness, difficulty with memory, difficulty with speech or problems with balance and  coordination.  Psych: Denies anxiety, depression, SI/HI.  No other specific complaints in a complete review of systems (except as listed in HPI above).    BP 120/76   Pulse (!) 105   Temp 98.7 F (37.1 C) (Temporal)   Ht 5\' 6"  (1.676 m)   Wt 158 lb (71.7 kg)   SpO2 98%   BMI 25.50 kg/m   Wt Readings from Last 3 Encounters:  04/09/18 71.2 kg  10/09/17 66.2 kg  07/09/17 66.7 kg    General: Appears his stated age, well developed, well nourished in NAD. Skin: Warm, dry and intact. No ulcerations noted. HEENT: Head: normal shape and size; Eyes: sclera white, no icterus, conjunctiva pink, PERRLA and EOMs intact; Ears: Tm's gray and intact, normal light reflex;  Neck:  Neck supple, trachea midline. No masses, lumps or thyromegaly present.  Cardiovascular: Tachycardic with normal rhythm. S1,S2 noted.  No murmur, rubs or gallops noted. No JVD or BLE edema. No carotid bruits noted. Pulmonary/Chest: Normal effort and positive vesicular breath sounds. No respiratory distress. No wheezes, rales or ronchi noted.  Abdomen: Soft and nontender. Normal bowel sounds. No distention or masses noted. Liver, spleen and kidneys non palpable. Musculoskeletal: Strength 5/5 BUE/BLE. No difficulty with gait.  Neurological: Alert and oriented. Cranial nerves II-XII grossly intact. Coordination normal.  Psychiatric: Mood and affect normal. Behavior is normal. Judgment and thought content normal.   BMET    Component Value Date/Time   NA 136 03/31/2017 1223   K 4.3 03/31/2017 1223   CL 100 03/31/2017 1223   CO2 31 03/31/2017 1223   GLUCOSE 213 (H) 03/31/2017 1223   BUN 15 03/31/2017 1223   CREATININE 0.75 03/31/2017 1223   CALCIUM 9.4 03/31/2017 1223    Lipid Panel     Component Value Date/Time   CHOL 135 03/31/2017 1223   TRIG 130.0 03/31/2017 1223   HDL 27.50 (L) 03/31/2017 1223   CHOLHDL 5 03/31/2017 1223   VLDL 26.0 03/31/2017 1223   LDLCALC 82 03/31/2017 1223    CBC    Component Value  Date/Time   WBC 6.8 03/31/2017 1223   RBC 4.75 03/31/2017 1223   HGB 14.3 03/31/2017 1223   HCT 42.2 03/31/2017 1223   PLT 228.0 03/31/2017 1223   MCV 89.0 03/31/2017 1223   MCHC 33.9 03/31/2017 1223   RDW 12.6 03/31/2017 1223    Hgb A1C Lab Results  Component Value Date   HGBA1C 6.0 (A) 04/09/2018    Assessment & Plan:  Preventative Health Maintenance:  Flu shot today He declines tetanus booster Encouraged him to consume a balanced diet and exercise regimen Advised him to see  an eye doctor and dentist annually Will check CBC, CMET, A1C, Lipid panel   RTC in 1 year or sooner if other concerns Webb Silversmith, NP

## 2019-01-21 LAB — CBC
HCT: 45.3 % (ref 39.0–52.0)
Hemoglobin: 15.5 g/dL (ref 13.0–17.0)
MCHC: 34.3 g/dL (ref 30.0–36.0)
MCV: 88.3 fl (ref 78.0–100.0)
Platelets: 226 10*3/uL (ref 150.0–400.0)
RBC: 5.13 Mil/uL (ref 4.22–5.81)
RDW: 11.9 % (ref 11.5–15.5)
WBC: 7.9 10*3/uL (ref 4.0–10.5)

## 2019-01-21 LAB — COMPREHENSIVE METABOLIC PANEL
ALT: 21 U/L (ref 0–53)
AST: 16 U/L (ref 0–37)
Albumin: 4.7 g/dL (ref 3.5–5.2)
Alkaline Phosphatase: 53 U/L (ref 39–117)
BUN: 23 mg/dL (ref 6–23)
CO2: 27 mEq/L (ref 19–32)
Calcium: 10.2 mg/dL (ref 8.4–10.5)
Chloride: 100 mEq/L (ref 96–112)
Creatinine, Ser: 1 mg/dL (ref 0.40–1.50)
GFR: 82.53 mL/min (ref >60.00)
Glucose, Bld: 320 mg/dL — ABNORMAL HIGH (ref 70–99)
Potassium: 5.3 mEq/L — ABNORMAL HIGH (ref 3.5–5.1)
Sodium: 135 mEq/L (ref 135–145)
Total Bilirubin: 0.6 mg/dL (ref 0.2–1.2)
Total Protein: 7.3 g/dL (ref 6.0–8.3)

## 2019-01-21 LAB — HEMOGLOBIN A1C: Hgb A1c MFr Bld: 10 % — ABNORMAL HIGH (ref 4.6–6.5)

## 2019-01-21 LAB — LIPID PANEL
Cholesterol: 213 mg/dL — ABNORMAL HIGH (ref 0–200)
HDL: 33.9 mg/dL — ABNORMAL LOW (ref >39.00)
LDL Cholesterol: 144 mg/dL — ABNORMAL HIGH (ref 0–99)
NonHDL: 178.76
Total CHOL/HDL Ratio: 6
Triglycerides: 173 mg/dL — ABNORMAL HIGH (ref 0.0–149.0)
VLDL: 34.6 mg/dL (ref 0.0–40.0)

## 2019-01-24 ENCOUNTER — Telehealth: Payer: Self-pay

## 2019-01-24 NOTE — Telephone Encounter (Signed)
Left message on VM per DPR asking pt to either call or send a MyChart message as to what he wants to take as an add on agent for his diabetes.

## 2019-01-27 ENCOUNTER — Other Ambulatory Visit: Payer: Self-pay | Admitting: Internal Medicine

## 2019-01-27 MED ORDER — SITAGLIPTIN PHOSPHATE 100 MG PO TABS: 100.0000 mg | ORAL_TABLET | Freq: Every day | ORAL | 2 refills | Status: DC

## 2019-02-18 ENCOUNTER — Encounter: Payer: Self-pay | Admitting: Internal Medicine

## 2019-03-09 IMAGING — DX DG SHOULDER 2+V*R*
3 series · 3 of 3 positions shown · non-contrast
Comparison: None.

CLINICAL DATA: Right shoulder pain no known injury, initial
encounter

EXAM:
RIGHT SHOULDER - 2+ VIEW

[shoulder axial]
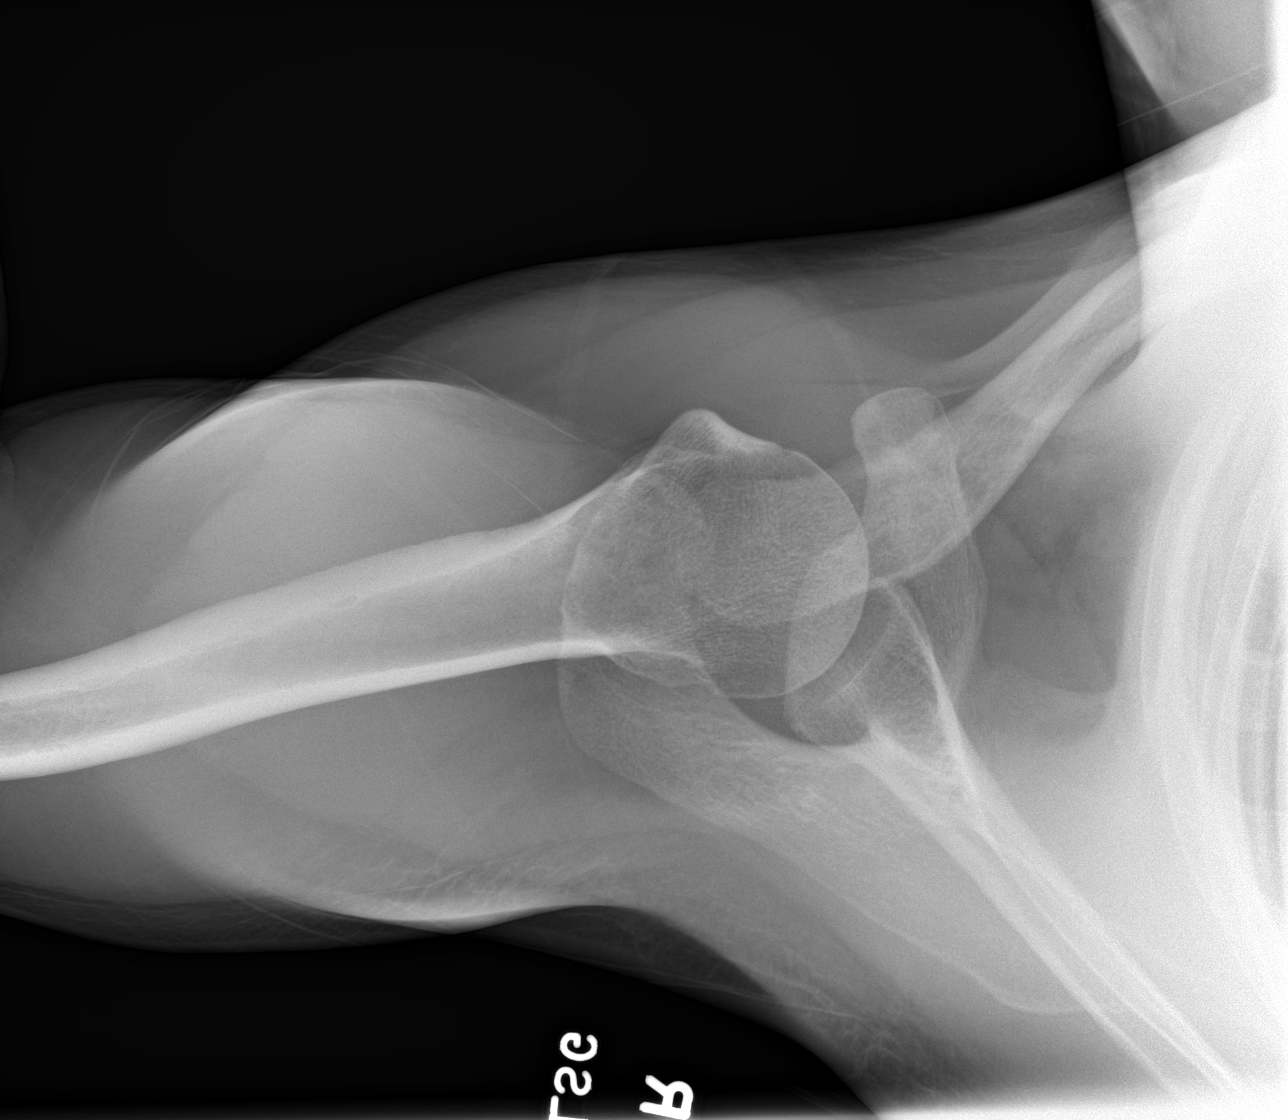

[shoulder ap]
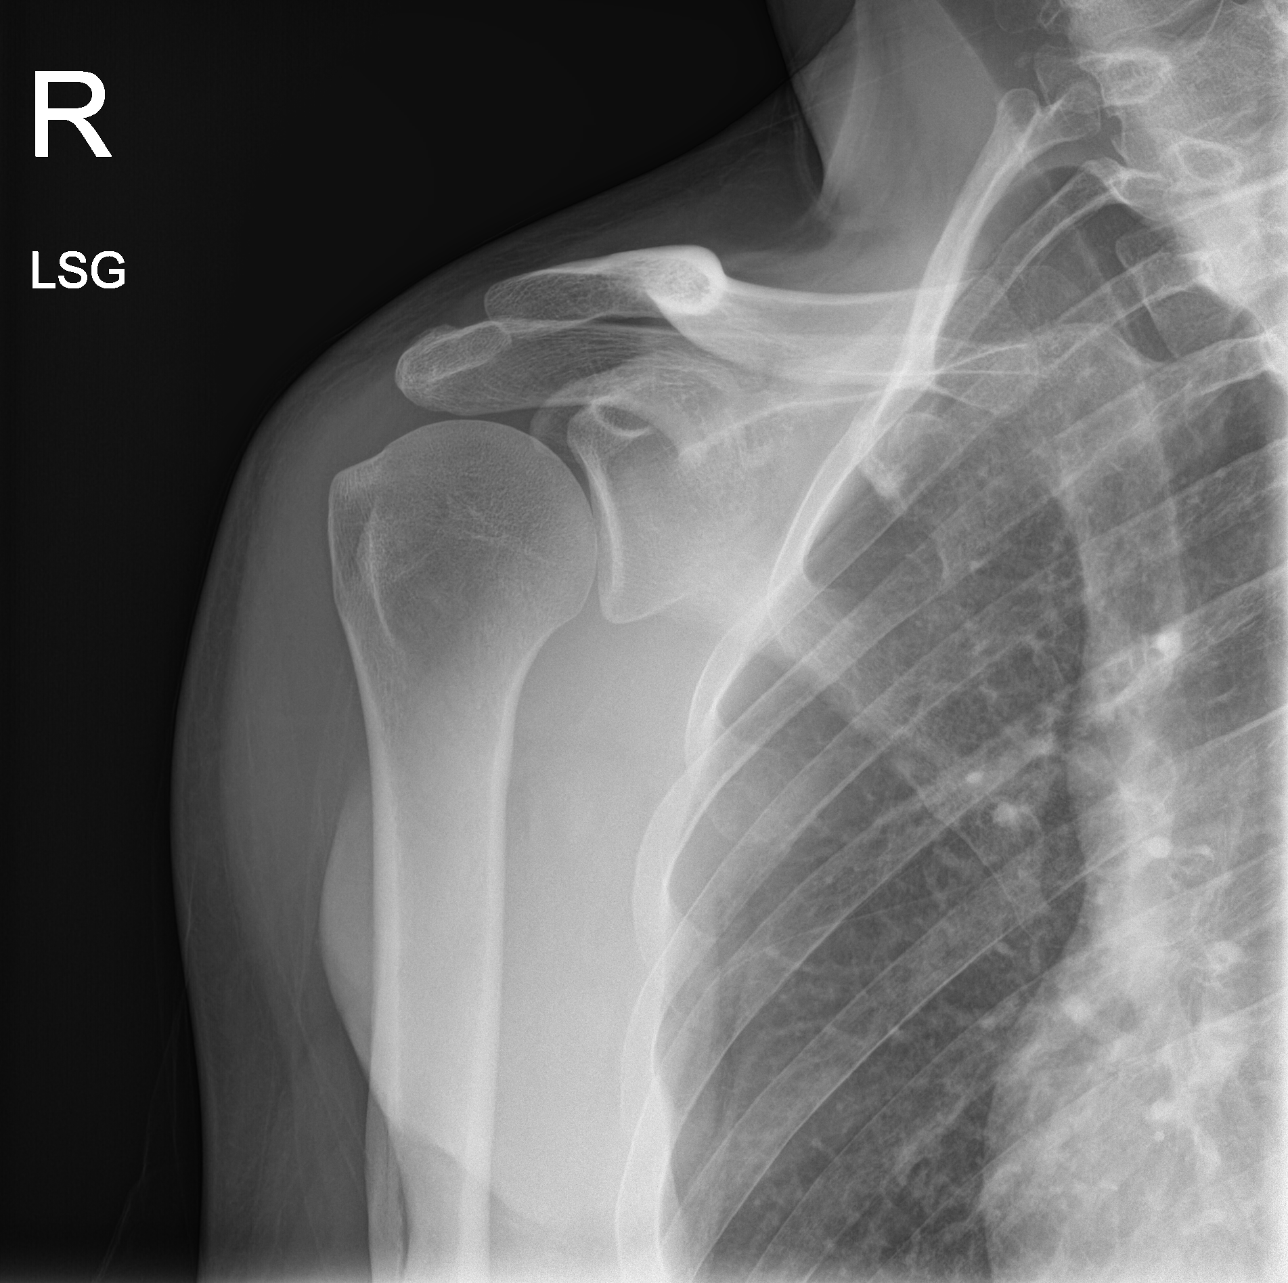

[shoulder y-view]
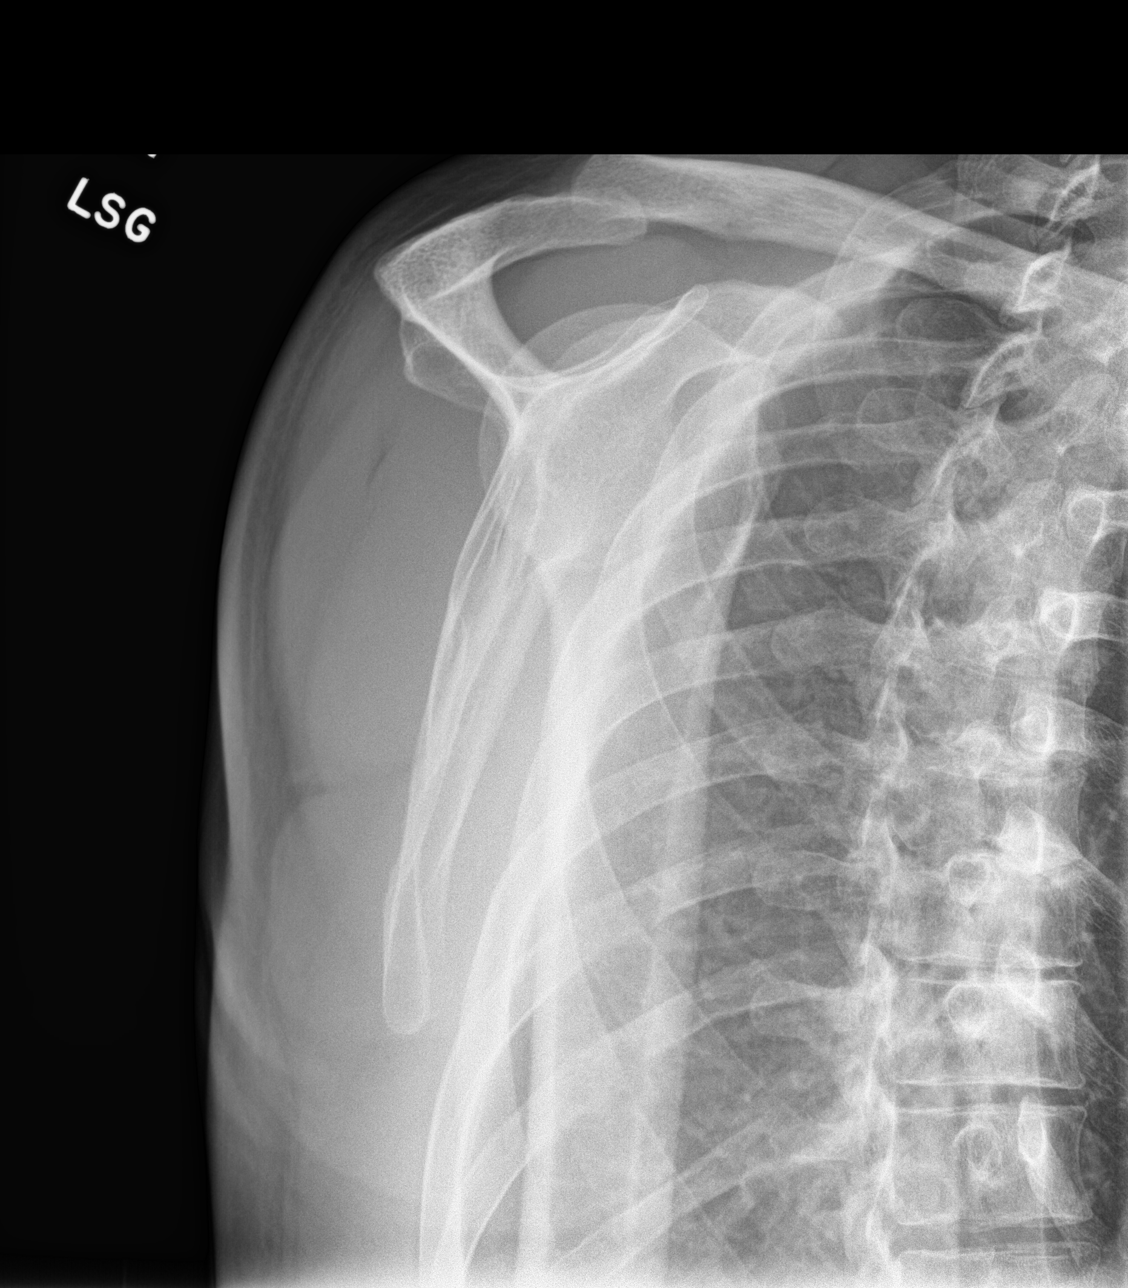

[3 of 3 positions shown; findings below may reference images not displayed]

FINDINGS: There is no evidence of fracture or dislocation. There is no
evidence of arthropathy or other focal bone abnormality. Soft
tissues are unremarkable.
IMPRESSION: No acute abnormality noted.

## 2019-04-12 ENCOUNTER — Other Ambulatory Visit: Payer: Self-pay | Admitting: Internal Medicine

## 2019-04-20 ENCOUNTER — Other Ambulatory Visit: Payer: Self-pay | Admitting: Internal Medicine

## 2019-05-30 ENCOUNTER — Other Ambulatory Visit: Payer: Self-pay | Admitting: Internal Medicine

## 2019-06-21 ENCOUNTER — Other Ambulatory Visit: Payer: Self-pay | Admitting: Internal Medicine

## 2019-06-21 ENCOUNTER — Encounter: Payer: Self-pay | Admitting: Internal Medicine

## 2019-06-22 MED ORDER — LISINOPRIL 5 MG PO TABS: 5.0000 mg | ORAL_TABLET | Freq: Every day | ORAL | 0 refills | Status: DC

## 2019-06-22 MED ORDER — SITAGLIPTIN PHOSPHATE 100 MG PO TABS: 100.0000 mg | ORAL_TABLET | Freq: Every day | ORAL | 1 refills | Status: DC

## 2019-06-22 MED ORDER — GLIPIZIDE 10 MG PO TABS: 10.0000 mg | ORAL_TABLET | Freq: Two times a day (BID) | ORAL | 0 refills | Status: DC

## 2019-08-23 ENCOUNTER — Other Ambulatory Visit: Payer: Self-pay | Admitting: Internal Medicine

## 2019-08-24 ENCOUNTER — Encounter: Payer: Self-pay | Admitting: Internal Medicine

## 2019-08-24 NOTE — Telephone Encounter (Signed)
A1C was too high. He at least has to have labs first

## 2019-09-15 ENCOUNTER — Other Ambulatory Visit: Payer: Self-pay | Admitting: Internal Medicine

## 2019-09-18 ENCOUNTER — Other Ambulatory Visit: Payer: Self-pay | Admitting: Internal Medicine

## 2019-09-30 ENCOUNTER — Other Ambulatory Visit: Payer: Self-pay | Admitting: Internal Medicine

## 2019-10-04 ENCOUNTER — Other Ambulatory Visit: Payer: Self-pay

## 2019-10-04 ENCOUNTER — Ambulatory Visit: Payer: Commercial Managed Care - PPO | Admitting: Internal Medicine

## 2019-10-04 ENCOUNTER — Encounter: Payer: Self-pay | Admitting: Internal Medicine

## 2019-10-04 VITALS — BP 120/82 | HR 87 | Temp 98.3°F | Wt 159.0 lb

## 2019-10-04 DIAGNOSIS — E119 Type 2 diabetes mellitus without complications: Secondary | ICD-10-CM | POA: Diagnosis not present

## 2019-10-04 DIAGNOSIS — E782 Mixed hyperlipidemia: Secondary | ICD-10-CM

## 2019-10-04 LAB — COMPREHENSIVE METABOLIC PANEL
ALT: 12 U/L (ref 0–53)
AST: 14 U/L (ref 0–37)
Albumin: 4.4 g/dL (ref 3.5–5.2)
Alkaline Phosphatase: 48 U/L (ref 39–117)
BUN: 17 mg/dL (ref 6–23)
CO2: 30 mEq/L (ref 19–32)
Calcium: 9.5 mg/dL (ref 8.4–10.5)
Chloride: 99 mEq/L (ref 96–112)
Creatinine, Ser: 0.91 mg/dL (ref 0.40–1.50)
GFR: 91.7 mL/min (ref >60.00)
Glucose, Bld: 287 mg/dL — ABNORMAL HIGH (ref 70–99)
Potassium: 4.2 mEq/L (ref 3.5–5.1)
Sodium: 135 mEq/L (ref 135–145)
Total Bilirubin: 0.6 mg/dL (ref 0.2–1.2)
Total Protein: 6.6 g/dL (ref 6.0–8.3)

## 2019-10-04 LAB — HEMOGLOBIN A1C: Hgb A1c MFr Bld: 8.2 % — ABNORMAL HIGH (ref 4.6–6.5)

## 2019-10-04 LAB — LIPID PANEL
Cholesterol: 159 mg/dL (ref 0–200)
HDL: 32.2 mg/dL — ABNORMAL LOW (ref >39.00)
NonHDL: 127.12
Total CHOL/HDL Ratio: 5
Triglycerides: 285 mg/dL — ABNORMAL HIGH (ref 0.0–149.0)
VLDL: 57 mg/dL — ABNORMAL HIGH (ref 0.0–40.0)

## 2019-10-04 LAB — LDL CHOLESTEROL, DIRECT: Direct LDL: 109 mg/dL

## 2019-10-04 MED ORDER — SITAGLIPTIN PHOSPHATE 100 MG PO TABS: ORAL_TABLET | ORAL | 3 refills | Status: DC

## 2019-10-04 NOTE — Assessment & Plan Note (Signed)
C met, lipid and A1c today No urine microalbumin secondary to ACEI therapy Encouraged him to consume a low carb diet and exercise for weight loss Continue Glipizide, Januvia and Lisinopril Foot exam today Encouraged routine eye exam Flu and Pneumovax UTD He is considering getting a Covid vaccine

## 2019-10-04 NOTE — Patient Instructions (Signed)

## 2019-10-04 NOTE — Progress Notes (Signed)
Subjective:    Patient ID: Thomas Stephenson, male    DOB: Sep 11, 1980, 39 y.o.   MRN: [id number]  HPI  Patient presents the clinic today for follow-up of DM 2.  His last A1c was 10%, 01/2019.  His last LDL was 144, triglycerides 173, 01/2019.  His sugars range.  He is currently taking Glipizide, Januvia and is on Lisinopril for renal protection.  He checks his feet routinely.  His last eye exam was > 1 year ago.  Flu 03/2018.  Pneumovax 03/2017.  Covid never.  Review of Systems      Past Medical History:  Diagnosis Date  . Diabetes mellitus without complication (HCC)     Current Outpatient Medications  Medication Sig Dispense Refill  . BD ULTRA-FINE LANCETS lancets 1 each by Other route 2 (two) times daily. Use as instructed 100 each 11  . glipiZIDE (GLUCOTROL) 10 MG tablet TAKE 1 TABLET (10 MG TOTAL) BY MOUTH 2 (TWO) TIMES DAILY BEFORE A MEAL. 60 tablet 0  . glucose blood (ONETOUCH VERIO) test strip 1 each by Other route 2 (two) times daily. Use as instructed 100 each 11  . JANUVIA 100 MG tablet TAKE 1 TABLET (100 MG TOTAL) BY MOUTH DAILY. MUST SCHEDULE LAB APPOINTMENT 30 tablet 0  . lisinopril (ZESTRIL) 5 MG tablet TAKE 1 TABLET BY MOUTH EVERY DAY 90 tablet 0   No current facility-administered medications for this visit.    Allergies  Allergen Reactions  . Mushroom Extract Complex     Family History  Problem Relation Age of Onset  . Thyroid disease Mother   . Alcohol abuse Father   . Drug abuse Father   . Schizophrenia Father   . Depression Father   . Colon cancer Neg Hx   . Stomach cancer Neg Hx     Social History   Socioeconomic History  . Marital status: Married    Spouse name: Not on file  . Number of children: Not on file  . Years of education: Not on file  . Highest education level: Not on file  Occupational History  . Not on file  Tobacco Use  . Smoking status: Never Smoker  . Smokeless tobacco: Never Used  Substance and Sexual Activity  . Alcohol use: Yes     Comment: rare  . Drug use: No  . Sexual activity: Yes  Other Topics Concern  . Not on file  Social History Narrative  . Not on file   Social Determinants of Health   Financial Resource Strain:   . Difficulty of Paying Living Expenses:   Food Insecurity:   . Worried About Programme researcher, broadcasting/film/video in the Last Year:   . Barista in the Last Year:   Transportation Needs:   . Freight forwarder (Medical):   Marland Kitchen Lack of Transportation (Non-Medical):   Physical Activity:   . Days of Exercise per Week:   . Minutes of Exercise per Session:   Stress:   . Feeling of Stress :   Social Connections:   . Frequency of Communication with Friends and Family:   . Frequency of Social Gatherings with Friends and Family:   . Attends Religious Services:   . Active Member of Clubs or Organizations:   . Attends Banker Meetings:   Marland Kitchen Marital Status:   Intimate Partner Violence:   . Fear of Current or Ex-Partner:   . Emotionally Abused:   Marland Kitchen Physically Abused:   . Sexually Abused:  Constitutional: Denies fever, malaise, fatigue, headache or abrupt weight changes.  Respiratory: Denies difficulty breathing, shortness of breath, cough or sputum production.   Cardiovascular: Denies chest pain, chest tightness, palpitations or swelling in the hands or feet.  Skin: Denies redness, rashes, lesions or ulcercations.  Neurological: Denies numbness, tingling or problems with balance and coordination.    No other specific complaints in a complete review of systems (except as listed in HPI above).  Objective:   Physical Exam   BP 120/82   Pulse 87   Temp 98.3 F (36.8 C) (Temporal)   Wt 159 lb (72.1 kg)   SpO2 98%   BMI 25.66 kg/m   Wt Readings from Last 3 Encounters:  01/20/19 158 lb (71.7 kg)  04/09/18 157 lb (71.2 kg)  10/09/17 146 lb (66.2 kg)    General: Appears his stated age, in NAD. Skin: Warm, dry and intact. No ulcerations noted. HEENT: Head: normal shape  and size; Eyes: sclera white, no icterus, conjunctiva pink, PERRLA and EOMs intact;  Cardiovascular: Normal rate and rhythm. S1,S2 noted.  No murmur, rubs or gallops noted. No JVD or BLE edema. Pulmonary/Chest: Normal effort and positive vesicular breath sounds. No respiratory distress. No wheezes, rales or ronchi noted.  Neurological: Alert and oriented. Sensation intact to BLE.   BMET    Component Value Date/Time   NA 135 01/20/2019 1507   K 5.3 No hemolysis seen (H) 01/20/2019 1507   CL 100 01/20/2019 1507   CO2 27 01/20/2019 1507   GLUCOSE 320 (H) 01/20/2019 1507   BUN 23 01/20/2019 1507   CREATININE 1.00 01/20/2019 1507   CALCIUM 10.2 01/20/2019 1507    Lipid Panel     Component Value Date/Time   CHOL 213 (H) 01/20/2019 1507   TRIG 173.0 (H) 01/20/2019 1507   HDL 33.90 (L) 01/20/2019 1507   CHOLHDL 6 01/20/2019 1507   VLDL 34.6 01/20/2019 1507   LDLCALC 144 (H) 01/20/2019 1507    CBC    Component Value Date/Time   WBC 7.9 01/20/2019 1507   RBC 5.13 01/20/2019 1507   HGB 15.5 01/20/2019 1507   HCT 45.3 01/20/2019 1507   PLT 226.0 01/20/2019 1507   MCV 88.3 01/20/2019 1507   MCHC 34.3 01/20/2019 1507   RDW 11.9 01/20/2019 1507    Hgb A1C Lab Results  Component Value Date   HGBA1C 10.0 (H) 01/20/2019           Assessment & Plan:    Nicki Reaper, NP This visit occurred during the SARS-CoV-2 public health emergency.  Safety protocols were in place, including screening questions prior to the visit, additional usage of staff PPE, and extensive cleaning of exam room while observing appropriate contact time as indicated for disinfecting solutions.

## 2019-10-04 NOTE — Assessment & Plan Note (Signed)
C met and lipid profile today Encouraged him to consume a low-fat diet Consider statin therapy if LDL greater than 100

## 2019-10-11 ENCOUNTER — Encounter: Payer: Self-pay | Admitting: Internal Medicine

## 2019-10-29 ENCOUNTER — Other Ambulatory Visit: Payer: Self-pay | Admitting: Internal Medicine

## 2019-12-22 ENCOUNTER — Other Ambulatory Visit: Payer: Self-pay | Admitting: Internal Medicine

## 2020-03-13 ENCOUNTER — Other Ambulatory Visit: Payer: Self-pay | Admitting: Internal Medicine

## 2020-03-18 ENCOUNTER — Other Ambulatory Visit: Payer: Self-pay | Admitting: Internal Medicine

## 2020-06-12 ENCOUNTER — Other Ambulatory Visit: Payer: Self-pay | Admitting: Internal Medicine

## 2020-09-05 ENCOUNTER — Other Ambulatory Visit: Payer: Self-pay | Admitting: Internal Medicine

## 2020-09-20 ENCOUNTER — Other Ambulatory Visit: Payer: Self-pay | Admitting: Internal Medicine

## 2020-12-09 ENCOUNTER — Other Ambulatory Visit: Payer: Self-pay | Admitting: Family

## 2020-12-15 ENCOUNTER — Other Ambulatory Visit: Payer: Self-pay | Admitting: Family

## 2020-12-30 ENCOUNTER — Other Ambulatory Visit: Payer: Self-pay | Admitting: Family

## 2021-01-03 ENCOUNTER — Other Ambulatory Visit: Payer: Self-pay | Admitting: Internal Medicine

## 2021-01-04 NOTE — Telephone Encounter (Signed)
Last office visit 10/04/2019 with R. Sampson Si for DM.  Last refilled 09/05/2020 for #180 by  P. Hyman Hopes, FNP.  Patient states on refill:  I'm almost out and won't have insurance until November to make it in for an appointment, so I'm not sure what to do. No future appointments.

## 2021-02-11 ENCOUNTER — Other Ambulatory Visit: Payer: Self-pay

## 2021-02-11 ENCOUNTER — Encounter: Payer: Self-pay | Admitting: Nurse Practitioner

## 2021-02-11 ENCOUNTER — Ambulatory Visit: Payer: BC Managed Care – PPO | Admitting: Nurse Practitioner

## 2021-02-11 VITALS — BP 122/78 | HR 98 | Temp 99.0°F | Resp 16 | Ht 66.0 in | Wt 150.0 lb

## 2021-02-11 DIAGNOSIS — Z7689 Persons encountering health services in other specified circumstances: Secondary | ICD-10-CM

## 2021-02-11 DIAGNOSIS — Z79899 Other long term (current) drug therapy: Secondary | ICD-10-CM | POA: Diagnosis not present

## 2021-02-11 DIAGNOSIS — E782 Mixed hyperlipidemia: Secondary | ICD-10-CM

## 2021-02-11 DIAGNOSIS — Z23 Encounter for immunization: Secondary | ICD-10-CM | POA: Diagnosis not present

## 2021-02-11 DIAGNOSIS — Z130 Encounter for screening for diseases of the blood and blood-forming organs and certain disorders involving the immune mechanism: Secondary | ICD-10-CM

## 2021-02-11 DIAGNOSIS — I10 Essential (primary) hypertension: Secondary | ICD-10-CM | POA: Diagnosis not present

## 2021-02-11 DIAGNOSIS — E119 Type 2 diabetes mellitus without complications: Secondary | ICD-10-CM | POA: Diagnosis not present

## 2021-02-11 MED ORDER — SITAGLIPTIN PHOSPHATE 100 MG PO TABS: ORAL_TABLET | ORAL | 3 refills | Status: DC

## 2021-02-11 MED ORDER — GLIPIZIDE 10 MG PO TABS: 10.0000 mg | ORAL_TABLET | Freq: Two times a day (BID) | ORAL | 3 refills | Status: DC

## 2021-02-11 MED ORDER — LISINOPRIL 5 MG PO TABS: 5.0000 mg | ORAL_TABLET | Freq: Every day | ORAL | 3 refills | Status: DC

## 2021-02-11 NOTE — Progress Notes (Signed)
BP 122/78   Pulse 98   Temp 99 F (37.2 C) (Oral)   Resp 16   Ht 5\' 6"  (1.676 m)   Wt 150 lb (68 kg)   SpO2 98%   BMI 24.21 kg/m    Subjective:    Patient ID: Thomas Stephenson, male    DOB: Jul 08, 1980, 40 y.o.   MRN: [id number]  HPI: Thomas Stephenson is a 40 y.o. male, here alone  Chief Complaint  Patient presents with   Establish Care   Diabetes   Hyperlipidemia   Establish care: Last physical was done for work two months ago. No A1C was done.  Hx of DM2, and hyperlipidemia.  Diabetes: He says he has been diagnosed with diabetes ten years ago.  He has taken metformin could not tolerate it so he was then placed on glipizide. He says he also tried ozempic but was taken off of it due to his optic neuritis.  He was prescribed Januvia and he took it for a period of time but it became too expensive because he changed insurance. He says he has been out of his glipizide for a few weeks and has not been taking his Januvia for awhile due to cost.  Last A1C was 8.2.  Will get labs and refill medication at this time.  He says that his insurance has changed again and he would like to see how much the Januvia is.  He will let 45 know if it is too expensive and we can discuss other options.  He says that he had a foot exam two months ago at his physical.  He says that he has not had an eye exam in a long time. He says he will contact his eye doctor and make an appointment.   Hyperlipidemia: Last LDL was 109, on 10/04/2019,  not currently on any medication.  Will recheck labs today.   Relevant past medical, surgical, family and social history reviewed and updated as indicated. Interim medical history since our last visit reviewed. Allergies and medications reviewed and updated.  Review of Systems  Constitutional: Negative for fever or weight change.  Respiratory: Negative for cough and shortness of breath.   Cardiovascular: Negative for chest pain or palpitations.  Gastrointestinal: Negative for  abdominal pain, no bowel changes.  Musculoskeletal: Negative for gait problem or joint swelling.  Skin: Negative for rash.  Neurological: Negative for dizziness or headache.  No other specific complaints in a complete review of systems (except as listed in HPI above).      Objective:    BP 122/78   Pulse 98   Temp 99 F (37.2 C) (Oral)   Resp 16   Ht 5\' 6"  (1.676 m)   Wt 150 lb (68 kg)   SpO2 98%   BMI 24.21 kg/m   Wt Readings from Last 3 Encounters:  02/11/21 150 lb (68 kg)  10/04/19 159 lb (72.1 kg)  01/20/19 158 lb (71.7 kg)    Physical Exam  Constitutional: Patient appears well-developed and well-nourished. No distress.  HEENT: head atraumatic, normocephalic, pupils equal and reactive to light,  neck supple Cardiovascular: Normal rate, regular rhythm and normal heart sounds.  No murmur heard. No BLE edema. Pulmonary/Chest: Effort normal and breath sounds normal. No respiratory distress. Abdominal: Soft.  There is no tenderness. Psychiatric: Patient has a normal mood and affect. behavior is normal. Judgment and thought content normal.   Results for orders placed or performed in visit on 10/04/19  Comprehensive metabolic panel  Result Value Ref Range   Sodium 135 135 - 145 mEq/L   Potassium 4.2 3.5 - 5.1 mEq/L   Chloride 99 96 - 112 mEq/L   CO2 30 19 - 32 mEq/L   Glucose, Bld 287 (H) 70 - 99 mg/dL   BUN 17 6 - 23 mg/dL   Creatinine, Ser 3.66 0.40 - 1.50 mg/dL   Total Bilirubin 0.6 0.2 - 1.2 mg/dL   Alkaline Phosphatase 48 39 - 117 U/L   AST 14 0 - 37 U/L   ALT 12 0 - 53 U/L   Total Protein 6.6 6.0 - 8.3 g/dL   Albumin 4.4 3.5 - 5.2 g/dL   GFR 29.47 >65.46 mL/min   Calcium 9.5 8.4 - 10.5 mg/dL  Lipid panel  Result Value Ref Range   Cholesterol 159 0 - 200 mg/dL   Triglycerides 503.5 (H) 0.0 - 149.0 mg/dL   HDL 46.56 (L) >81.27 mg/dL   VLDL 51.7 (H) 0.0 - 00.1 mg/dL   Total CHOL/HDL Ratio 5    NonHDL 127.12   Hemoglobin A1c  Result Value Ref Range   Hgb  A1c MFr Bld 8.2 (H) 4.6 - 6.5 %  LDL cholesterol, direct  Result Value Ref Range   Direct LDL 109.0 mg/dL      Assessment & Plan:   1. Encounter to establish care   2. Type 2 diabetes mellitus without complication, without long-term current use of insulin (HCC)  - COMPLETE METABOLIC PANEL WITH GFR - Microalbumin / creatinine urine ratio - Hemoglobin A1c - sitaGLIPtin (JANUVIA) 100 MG tablet; TAKE 1 TABLET BY MOUTH EVERY DAY  Dispense: 90 tablet; Refill: 3 - lisinopril (ZESTRIL) 5 MG tablet; Take 1 tablet (5 mg total) by mouth daily.  Dispense: 90 tablet; Refill: 3 - glipiZIDE (GLUCOTROL) 10 MG tablet; Take 1 tablet (10 mg total) by mouth 2 (two) times daily before a meal.  Dispense: 180 tablet; Refill: 3  3. Mixed hyperlipidemia  - Lipid panel  4. Medication management  - COMPLETE METABOLIC PANEL WITH GFR  5. Need for influenza vaccination  - Flu Vaccine QUAD 6+ mos PF IM (Fluarix Quad PF)  6. Screening for deficiency anemia  - CBC with Differential/Platelet   Follow up plan: Return in about 3 months (around 05/14/2021) for follow up.

## 2021-02-12 LAB — CBC WITH DIFFERENTIAL/PLATELET
Absolute Monocytes: 616 cells/uL (ref 200–950)
Basophils Absolute: 62 cells/uL (ref 0–200)
Basophils Relative: 0.7 %
Eosinophils Absolute: 158 cells/uL (ref 15–500)
Eosinophils Relative: 1.8 %
HCT: 49.6 % (ref 38.5–50.0)
Hemoglobin: 16.6 g/dL (ref 13.2–17.1)
Lymphs Abs: 1945 cells/uL (ref 850–3900)
MCH: 30 pg (ref 27.0–33.0)
MCHC: 33.5 g/dL (ref 32.0–36.0)
MCV: 89.5 fL (ref 80.0–100.0)
MPV: 10.4 fL (ref 7.5–12.5)
Monocytes Relative: 7 %
Neutro Abs: 6019 cells/uL (ref 1500–7800)
Neutrophils Relative %: 68.4 %
Platelets: 240 10*3/uL (ref 140–400)
RBC: 5.54 10*6/uL (ref 4.20–5.80)
RDW: 11.1 % (ref 11.0–15.0)
Total Lymphocyte: 22.1 %
WBC: 8.8 10*3/uL (ref 3.8–10.8)

## 2021-02-12 LAB — COMPLETE METABOLIC PANEL WITH GFR
AG Ratio: 1.5 (calc) (ref 1.0–2.5)
ALT: 16 U/L (ref 9–46)
AST: 16 U/L (ref 10–40)
Albumin: 4.7 g/dL (ref 3.6–5.1)
Alkaline phosphatase (APISO): 83 U/L (ref 36–130)
BUN: 17 mg/dL (ref 7–25)
CO2: 27 mmol/L (ref 20–32)
Calcium: 9.8 mg/dL (ref 8.6–10.3)
Chloride: 95 mmol/L — ABNORMAL LOW (ref 98–110)
Creat: 0.81 mg/dL (ref 0.60–1.29)
Globulin: 3.1 g/dL (calc) (ref 1.9–3.7)
Glucose, Bld: 458 mg/dL — ABNORMAL HIGH (ref 65–99)
Potassium: 4.4 mmol/L (ref 3.5–5.3)
Sodium: 133 mmol/L — ABNORMAL LOW (ref 135–146)
Total Bilirubin: 0.6 mg/dL (ref 0.2–1.2)
Total Protein: 7.8 g/dL (ref 6.1–8.1)
eGFR: 113 mL/min/{1.73_m2} (ref >=60)

## 2021-02-12 LAB — HEMOGLOBIN A1C
Hgb A1c MFr Bld: 12.6 % of total Hgb — ABNORMAL HIGH (ref <5.7)
Mean Plasma Glucose: 315 mg/dL
eAG (mmol/L): 17.4 mmol/L

## 2021-02-12 LAB — LIPID PANEL
Cholesterol: 203 mg/dL — ABNORMAL HIGH (ref <200)
HDL: 36 mg/dL — ABNORMAL LOW (ref >=40)
LDL Cholesterol (Calc): 135 mg/dL (calc) — ABNORMAL HIGH
Non-HDL Cholesterol (Calc): 167 mg/dL (calc) — ABNORMAL HIGH (ref <130)
Total CHOL/HDL Ratio: 5.6 (calc) — ABNORMAL HIGH (ref <5.0)
Triglycerides: 187 mg/dL — ABNORMAL HIGH (ref <150)

## 2021-02-12 LAB — MICROALBUMIN / CREATININE URINE RATIO
Creatinine, Urine: 41 mg/dL (ref 20–320)
Microalb Creat Ratio: 17 mcg/mg creat (ref <30)
Microalb, Ur: 0.7 mg/dL

## 2021-05-15 ENCOUNTER — Ambulatory Visit: Payer: BC Managed Care – PPO | Admitting: Nurse Practitioner

## 2021-05-20 ENCOUNTER — Ambulatory Visit (INDEPENDENT_AMBULATORY_CARE_PROVIDER_SITE_OTHER): Payer: BC Managed Care – PPO | Admitting: Nurse Practitioner

## 2021-05-20 ENCOUNTER — Encounter: Payer: Self-pay | Admitting: Nurse Practitioner

## 2021-05-20 VITALS — BP 116/74 | HR 94 | Temp 97.9°F | Resp 16 | Ht 66.0 in | Wt 151.3 lb

## 2021-05-20 DIAGNOSIS — E782 Mixed hyperlipidemia: Secondary | ICD-10-CM

## 2021-05-20 DIAGNOSIS — E119 Type 2 diabetes mellitus without complications: Secondary | ICD-10-CM | POA: Diagnosis not present

## 2021-05-20 DIAGNOSIS — Z23 Encounter for immunization: Secondary | ICD-10-CM

## 2021-05-20 NOTE — Progress Notes (Signed)
? ?BP 116/74 (BP Location: Left Arm, Patient Position: Sitting)   Pulse 94   Temp 97.9 ?F (36.6 ?C) (Oral)   Resp 16   Ht _0  (1.676 m)   Wt 151 lb 4.8 oz (68.6 kg)   SpO2 99%   BMI 24.42 kg/m?   ? ?Subjective:  ? ? Patient ID: Thomas Stephenson, male    DOB: 24-Apr-1980, 41 y.o.   MRN: [id number] ? ?HPI: ?Thomas Stephenson is a 41 y.o. male ? ?Chief Complaint  ?Patient presents with  ? Follow-up  ?  3 month follow up  ? ?DM: He has not been checking his blood sugar at home because he misplaced his glucometer.  He is going to look around and let me know if I need to order him another one.  He is currently taking januvia, and glipizide. He is also on lisinopril.  Last A1C was 12.6 on 02/11/2021.  He had run out of his medication.  He denies any hypoglycemia episodes, polyuria, polydipsia or polyphagia.  Will get A1C today.  He says he has not scheduled his eye exam yet but he will.  ? ?Hyperlipidemia:His last LDL was 135.  He is not currently on medication.  He is working on his diet. ?The 10-year ASCVD risk score (Arnett DK, et al., 2019) is: 3.7% ?  Values used to calculate the score: ?    Age: 23 years ?    Sex: Male ?    Is Non-Hispanic African American: No ?    Diabetic: Yes ?    Tobacco smoker: No ?    Systolic Blood Pressure: 616 mmHg ?    Is BP treated: No ?    HDL Cholesterol: 36 mg/dL ?    Total Cholesterol: 203 mg/dL  ? ?Relevant past medical, surgical, family and social history reviewed and updated as indicated. Interim medical history since our last visit reviewed. ?Allergies and medications reviewed and updated. ? ?Review of Systems ? ?Constitutional: Negative for fever or weight change.  ?Respiratory: Negative for cough and shortness of breath.   ?Cardiovascular: Negative for chest pain or palpitations.  ?Gastrointestinal: Negative for abdominal pain, no bowel changes.  ?Musculoskeletal: Negative for gait problem or joint swelling.  ?Skin: Negative for rash.  ?Neurological: Negative for dizziness or  headache.  ?No other specific complaints in a complete review of systems (except as listed in HPI above).  ? ?   ?Objective:  ?  ?BP 116/74 (BP Location: Left Arm, Patient Position: Sitting)   Pulse 94   Temp 97.9 ?F (36.6 ?C) (Oral)   Resp 16   Ht _1  (1.676 m)   Wt 151 lb 4.8 oz (68.6 kg)   SpO2 99%   BMI 24.42 kg/m?   ?Wt Readings from Last 3 Encounters:  ?05/20/21 151 lb 4.8 oz (68.6 kg)  ?02/11/21 150 lb (68 kg)  ?10/04/19 159 lb (72.1 kg)  ?  ?Physical Exam ? ?Constitutional: Patient appears well-developed and well-nourished.  No distress.  ?HEENT: head atraumatic, normocephalic, pupils equal and reactive to light,  neck supple ?Cardiovascular: Normal rate, regular rhythm and normal heart sounds.  No murmur heard. No BLE edema. ?Pulmonary/Chest: Effort normal and breath sounds normal. No respiratory distress. ?Abdominal: Soft.  There is no tenderness. ?Psychiatric: Patient has a normal mood and affect. behavior is normal. Judgment and thought content normal.  ? ?Results for orders placed or performed in visit on 02/11/21  ?CBC with Differential/Platelet  ?Result Value Ref Range  ? WBC 8.8  3.8 - 10.8 Thousand/uL  ? RBC 5.54 4.20 - 5.80 Million/uL  ? Hemoglobin 16.6 13.2 - 17.1 g/dL  ? HCT 49.6 38.5 - 50.0 %  ? MCV 89.5 80.0 - 100.0 fL  ? MCH 30.0 27.0 - 33.0 pg  ? MCHC 33.5 32.0 - 36.0 g/dL  ? RDW 11.1 11.0 - 15.0 %  ? Platelets 240 140 - 400 Thousand/uL  ? MPV 10.4 7.5 - 12.5 fL  ? Neutro Abs 6,019 1,500 - 7,800 cells/uL  ? Lymphs Abs 1,945 850 - 3,900 cells/uL  ? Absolute Monocytes 616 200 - 950 cells/uL  ? Eosinophils Absolute 158 15 - 500 cells/uL  ? Basophils Absolute 62 0 - 200 cells/uL  ? Neutrophils Relative % 68.4 %  ? Total Lymphocyte 22.1 %  ? Monocytes Relative 7.0 %  ? Eosinophils Relative 1.8 %  ? Basophils Relative 0.7 %  ?COMPLETE METABOLIC PANEL WITH GFR  ?Result Value Ref Range  ? Glucose, Bld 458 (H) 65 - 99 mg/dL  ? BUN 17 7 - 25 mg/dL  ? Creat 0.81 0.60 - 1.29 mg/dL  ? eGFR 113 >  OR = 60 mL/min/1.37m  ? BUN/Creatinine Ratio NOT APPLICABLE 6 - 22 (calc)  ? Sodium 133 (L) 135 - 146 mmol/L  ? Potassium 4.4 3.5 - 5.3 mmol/L  ? Chloride 95 (L) 98 - 110 mmol/L  ? CO2 27 20 - 32 mmol/L  ? Calcium 9.8 8.6 - 10.3 mg/dL  ? Total Protein 7.8 6.1 - 8.1 g/dL  ? Albumin 4.7 3.6 - 5.1 g/dL  ? Globulin 3.1 1.9 - 3.7 g/dL (calc)  ? AG Ratio 1.5 1.0 - 2.5 (calc)  ? Total Bilirubin 0.6 0.2 - 1.2 mg/dL  ? Alkaline phosphatase (APISO) 83 36 - 130 U/L  ? AST 16 10 - 40 U/L  ? ALT 16 9 - 46 U/L  ?Lipid panel  ?Result Value Ref Range  ? Cholesterol 203 (H) <200 mg/dL  ? HDL 36 (L) > OR = 40 mg/dL  ? Triglycerides 187 (H) <150 mg/dL  ? LDL Cholesterol (Calc) 135 (H) mg/dL (calc)  ? Total CHOL/HDL Ratio 5.6 (H) <5.0 (calc)  ? Non-HDL Cholesterol (Calc) 167 (H) <130 mg/dL (calc)  ?Microalbumin / creatinine urine ratio  ?Result Value Ref Range  ? Creatinine, Urine 41 20 - 320 mg/dL  ? Microalb, Ur 0.7 mg/dL  ? Microalb Creat Ratio 17 <30 mcg/mg creat  ?Hemoglobin A1c  ?Result Value Ref Range  ? Hgb A1c MFr Bld 12.6 (H) <5.7 % of total Hgb  ? Mean Plasma Glucose 315 mg/dL  ? eAG (mmol/L) 17.4 mmol/L  ? ?   ?Assessment & Plan:  ? ?1. Type 2 diabetes mellitus without complication, without long-term current use of insulin ([place] ?-continue current treatment ?- Hemoglobin A1c ? ?2. Mixed hyperlipidemia ?-continue improving your diet ? ?3. Need for vaccination ? ?- Tdap vaccine greater than or equal to 7yo IM  ? ?Follow up plan: ?Return in about 3 months (around 08/20/2021) for follow up. ? ? ? ? ? ?

## 2021-05-21 LAB — HEMOGLOBIN A1C
Hgb A1c MFr Bld: 8.7 % of total Hgb — ABNORMAL HIGH (ref <5.7)
Mean Plasma Glucose: 203 mg/dL
eAG (mmol/L): 11.2 mmol/L

## 2022-01-27 ENCOUNTER — Other Ambulatory Visit: Payer: Self-pay | Admitting: Nurse Practitioner

## 2022-01-27 DIAGNOSIS — E119 Type 2 diabetes mellitus without complications: Secondary | ICD-10-CM

## 2022-01-27 NOTE — Telephone Encounter (Signed)
Requested medication (s) are due for refill today: yes  Requested medication (s) are on the active medication list: yes  Last refill:  02/11/21 #90/3  Future visit scheduled: no  Notes to clinic:  pt is due for OV and updated labs. Called, LVMTCB to schedule.     Requested Prescriptions  Pending Prescriptions Disp Refills   glipiZIDE (GLUCOTROL) 10 MG tablet [Pharmacy Med Name: GLIPIZIDE 10 MG TABLET] 180 tablet 3    Sig: Take 1 tablet (10 mg total) by mouth 2 (two) times daily before a meal.     Endocrinology:  Diabetes - Sulfonylureas Failed - 01/27/2022  2:05 AM      Failed - HBA1C is between 0 and 7.9 and within 180 days    Hgb A1c MFr Bld  Date Value Ref Range Status  05/20/2021 8.7 (H) <5.7 % of total Hgb Final    Comment:    For someone without known diabetes, a hemoglobin A1c value of 6.5% or greater indicates that they may have  diabetes and this should be confirmed with a follow-up  test. . For someone with known diabetes, a value <7% indicates  that their diabetes is well controlled and a value  greater than or equal to 7% indicates suboptimal  control. A1c targets should be individualized based on  duration of diabetes, age, comorbid conditions, and  other considerations. . Currently, no consensus exists regarding use of hemoglobin A1c for diagnosis of diabetes for children. .          Failed - Valid encounter within last 6 months    Recent Outpatient Visits           8 months ago Type 2 diabetes mellitus without complication, without long-term current use of insulin [facility])   [facility] Berniece Salines, FNP   11 months ago Type 2 diabetes mellitus without complication, without long-term current use of insulin [facility])   [facility] [place], Rudolpho Sevin, FNP              Passed - Cr in normal range and within 360 days    Creat  Date Value Ref Range Status  02/11/2021 0.81 0.60 - 1.29 mg/dL Final   Creatinine,U   Date Value Ref Range Status  03/31/2017 232.7 mg/dL Final   Creatinine, Urine  Date Value Ref Range Status  02/11/2021 41 20 - 320 mg/dL Final          JANUVIA 100 MG tablet [Pharmacy Med Name: JANUVIA 100 MG TABLET] 90 tablet 3    Sig: TAKE 1 TABLET BY MOUTH EVERY DAY     Endocrinology:  Diabetes - DPP-4 Inhibitors Failed - 01/27/2022  2:05 AM      Failed - HBA1C is between 0 and 7.9 and within 180 days    Hgb A1c MFr Bld  Date Value Ref Range Status  05/20/2021 8.7 (H) <5.7 % of total Hgb Final    Comment:    For someone without known diabetes, a hemoglobin A1c value of 6.5% or greater indicates that they may have  diabetes and this should be confirmed with a follow-up  test. . For someone with known diabetes, a value <7% indicates  that their diabetes is well controlled and a value  greater than or equal to 7% indicates suboptimal  control. A1c targets should be individualized based on  duration of diabetes, age, comorbid conditions, and  other considerations. . Currently, no consensus exists regarding use of hemoglobin A1c  for diagnosis of diabetes for children. .          Failed - Valid encounter within last 6 months    Recent Outpatient Visits           8 months ago Type 2 diabetes mellitus without complication, without long-term current use of insulin [facility])   [facility] Della Goo F, FNP   11 months ago Type 2 diabetes mellitus without complication, without long-term current use of insulin [facility])   [facility] [place], Rudolpho Sevin, [place]              Passed - Cr in normal range and within 360 days    Creat  Date Value Ref Range Status  02/11/2021 0.81 0.60 - 1.29 mg/dL Final   Creatinine,U  Date Value Ref Range Status  03/31/2017 232.7 mg/dL Final   Creatinine, Urine  Date Value Ref Range Status  02/11/2021 41 20 - 320 mg/dL Final

## 2022-01-27 NOTE — Telephone Encounter (Signed)
Patient called, left VM to return the call to the office to scheduled an appt for medication refill request.   

## 2022-01-31 ENCOUNTER — Ambulatory Visit: Payer: BC Managed Care – PPO | Admitting: Nurse Practitioner

## 2022-01-31 ENCOUNTER — Other Ambulatory Visit: Payer: Self-pay

## 2022-01-31 ENCOUNTER — Encounter: Payer: Self-pay | Admitting: Nurse Practitioner

## 2022-01-31 VITALS — BP 128/78 | HR 98 | Temp 98.9°F | Resp 18 | Ht 66.0 in | Wt 155.6 lb

## 2022-01-31 DIAGNOSIS — Z7984 Long term (current) use of oral hypoglycemic drugs: Secondary | ICD-10-CM

## 2022-01-31 DIAGNOSIS — J069 Acute upper respiratory infection, unspecified: Secondary | ICD-10-CM | POA: Diagnosis not present

## 2022-01-31 DIAGNOSIS — E782 Mixed hyperlipidemia: Secondary | ICD-10-CM

## 2022-01-31 DIAGNOSIS — Z130 Encounter for screening for diseases of the blood and blood-forming organs and certain disorders involving the immune mechanism: Secondary | ICD-10-CM

## 2022-01-31 DIAGNOSIS — Z1159 Encounter for screening for other viral diseases: Secondary | ICD-10-CM | POA: Diagnosis not present

## 2022-01-31 DIAGNOSIS — E119 Type 2 diabetes mellitus without complications: Secondary | ICD-10-CM | POA: Diagnosis not present

## 2022-01-31 DIAGNOSIS — R051 Acute cough: Secondary | ICD-10-CM | POA: Diagnosis not present

## 2022-01-31 LAB — POCT INFLUENZA A/B
Influenza A, POC: NEGATIVE
Influenza B, POC: NEGATIVE

## 2022-01-31 MED ORDER — BLOOD GLUCOSE MONITOR KIT: PACK | 0 refills | Status: DC

## 2022-01-31 MED ORDER — BENZONATATE 100 MG PO CAPS: 200.0000 mg | ORAL_CAPSULE | Freq: Two times a day (BID) | ORAL | 0 refills | Status: DC | PRN

## 2022-01-31 MED ORDER — LANCETS MISC: 0 refills | Status: DC

## 2022-01-31 MED ORDER — LISINOPRIL 5 MG PO TABS: 5.0000 mg | ORAL_TABLET | Freq: Every day | ORAL | 3 refills | Status: DC

## 2022-01-31 MED ORDER — ALBUTEROL SULFATE (2.5 MG/3ML) 0.083% IN NEBU: 2.5000 mg | INHALATION_SOLUTION | Freq: Four times a day (QID) | RESPIRATORY_TRACT | 1 refills | Status: DC | PRN

## 2022-01-31 MED ORDER — BLOOD GLUCOSE TEST VI STRP: ORAL_STRIP | 3 refills | Status: DC

## 2022-01-31 NOTE — Assessment & Plan Note (Signed)
Getting labs today, recommend working on lifestyle modification

## 2022-01-31 NOTE — Assessment & Plan Note (Signed)
Continue taking januvia 100 mg daily, glipizide 10 mg BID and lisinopril 5 mg daily.

## 2022-01-31 NOTE — Progress Notes (Signed)
BP 128/78   Pulse 98   Temp 98.9 F (37.2 C) (Oral)   Resp 18   Ht _0  (1.676 m)   Wt 155 lb 9.6 oz (70.6 kg)   SpO2 99%   BMI 25.11 kg/m    Subjective:    Patient ID: Thomas Stephenson, male    DOB: 15-Feb-1981, 41 y.o.   MRN: [id number]  HPI: Thomas Stephenson is a 41 y.o. male  Chief Complaint  Patient presents with   Diabetes   URI    Cough, congested   Hyperlipidemia   DM: His last A1C was 8.7 on 05/20/2021.  He currently takes januvia 100 mg daily, glipizide 10 mg BID and lisinopril 5 mg daily.  He denies any polyphagia, polydipsia or polyuria.  Will get labs and do foot exam. Patient does not have a glucometer to check his blood sugar.  Will send in new order.   Hyperlipidemia:He is not currently on medication for his cholesterol. His last LDL was 135 on 02/11/2021. Will get labs today. He says his diet has not been the best. The 10-year ASCVD risk score (Arnett DK, et al., 2019) is: 4.9%   Values used to calculate the score:     Age: 45 years     Sex: Male     Is Non-Hispanic African American: No     Diabetic: Yes     Tobacco smoker: No     Systolic Blood Pressure: 361 mmHg     Is BP treated: No     HDL Cholesterol: 36 mg/dL     Total Cholesterol: 203 mg/dL   URI:  He says that he has had nasal congestion, coughing, thick mucous and feels a little short of breath. He denies any fever. He says symptoms started on Monday or Tuesday.  He says he has been taking dayquil but it has not really helped.  Recommend pushing fluids, taking vitamin d, c and zinc.  OTC treatment zyrtec, flonase and mucinex. Offered covid and flu testing.  Flu test was negative. Discussed treatment for covid if positive. Discussed side effects and administration.  He would like to take the antiviral treatment if positive. Patient is wheezing will send in albuterol inhaler.    Relevant past medical, surgical, family and social history reviewed and updated as indicated. Interim medical history since our last  visit reviewed. Allergies and medications reviewed and updated.  Review of Systems  Constitutional: Negative for fever or weight change.  HEENT: positive for nasal congestion Respiratory:positive for cough and shortness of breath.   Cardiovascular: Negative for chest pain or palpitations.  Gastrointestinal: Negative for abdominal pain, no bowel changes.  Musculoskeletal: Negative for gait problem or joint swelling.  Skin: Negative for rash.  Neurological: Negative for dizziness or headache.  No other specific complaints in a complete review of systems (except as listed in HPI above).      Objective:    BP 128/78   Pulse 98   Temp 98.9 F (37.2 C) (Oral)   Resp 18   Ht _1  (1.676 m)   Wt 155 lb 9.6 oz (70.6 kg)   SpO2 99%   BMI 25.11 kg/m   Wt Readings from Last 3 Encounters:  01/31/22 155 lb 9.6 oz (70.6 kg)  05/20/21 151 lb 4.8 oz (68.6 kg)  02/11/21 150 lb (68 kg)    Physical Exam  Constitutional: Patient appears well-developed and well-nourished.  No distress.  HEENT: head atraumatic, normocephalic, pupils equal and reactive to  light,  neck supple Cardiovascular: Normal rate, regular rhythm and normal heart sounds.  No murmur heard. No BLE edema. Pulmonary/Chest: Effort normal and breath sounds expiratory wheezing. No respiratory distress. Abdominal: Soft.  There is no tenderness. Psychiatric: Patient has a normal mood and affect. behavior is normal. Judgment and thought content normal.  Diabetic Foot Exam - Simple   Simple Foot Form Diabetic Foot exam was performed with the following findings: Yes 01/31/2022  8:39 AM  Visual Inspection No deformities, no ulcerations, no other skin breakdown bilaterally: Yes Sensation Testing Intact to touch and monofilament testing bilaterally: Yes Pulse Check Posterior Tibialis and Dorsalis pulse intact bilaterally: Yes Comments     Results for orders placed or performed in visit on 01/31/22  POCT Influenza A/B  Result  Value Ref Range   Influenza A, POC Negative Negative   Influenza B, POC Negative Negative      Assessment & Plan:   Problem List Items Addressed This Visit       Endocrine   Type 2 diabetes mellitus without complication, without long-term current use of insulin (HCC) - Primary    Continue taking januvia 100 mg daily, glipizide 10 mg BID and lisinopril 5 mg daily.       Relevant Medications   lisinopril (ZESTRIL) 5 MG tablet   blood glucose meter kit and supplies KIT   Glucose Blood (BLOOD GLUCOSE TEST STRIPS) STRP   Lancets MISC   Other Relevant Orders   HM Diabetes Foot Exam (Completed)   Microalbumin / creatinine urine ratio   COMPLETE METABOLIC PANEL WITH GFR   Hemoglobin A1c     Other   Mixed hyperlipidemia    Getting labs today, recommend working on lifestyle modification      Relevant Medications   lisinopril (ZESTRIL) 5 MG tablet   Other Relevant Orders   COMPLETE METABOLIC PANEL WITH GFR   Lipid panel   Other Visit Diagnoses     Screening for deficiency anemia       Relevant Orders   CBC with Differential/Platelet   Encounter for hepatitis C screening test for low risk patient       Relevant Orders   Hepatitis C antibody   Viral upper respiratory tract infection       flu and covid testing, push fluids and get rest.  Start flonase, zyrtec and mucinex.  can take vitamin d, c and zinc   Relevant Medications   albuterol (PROVENTIL) (2.5 MG/3ML) 0.083% nebulizer solution   benzonatate (TESSALON) 100 MG capsule   Other Relevant Orders   POCT Influenza A/B (Completed)   Novel Coronavirus, NAA ([facility])        Follow up plan: Return in about 4 months (around 06/01/2022) for follow up.

## 2022-02-01 LAB — COMPLETE METABOLIC PANEL WITH GFR
AG Ratio: 1.6 (calc) (ref 1.0–2.5)
ALT: 17 U/L (ref 9–46)
AST: 16 U/L (ref 10–40)
Albumin: 4.5 g/dL (ref 3.6–5.1)
Alkaline phosphatase (APISO): 64 U/L (ref 36–130)
BUN: 11 mg/dL (ref 7–25)
CO2: 27 mmol/L (ref 20–32)
Calcium: 9.3 mg/dL (ref 8.6–10.3)
Chloride: 101 mmol/L (ref 98–110)
Creat: 0.81 mg/dL (ref 0.60–1.29)
Globulin: 2.8 g/dL (calc) (ref 1.9–3.7)
Glucose, Bld: 225 mg/dL — ABNORMAL HIGH (ref 65–99)
Potassium: 4.3 mmol/L (ref 3.5–5.3)
Sodium: 139 mmol/L (ref 135–146)
Total Bilirubin: 0.6 mg/dL (ref 0.2–1.2)
Total Protein: 7.3 g/dL (ref 6.1–8.1)
eGFR: 112 mL/min/{1.73_m2} (ref >=60)

## 2022-02-01 LAB — CBC WITH DIFFERENTIAL/PLATELET
Absolute Monocytes: 694 cells/uL (ref 200–950)
Basophils Absolute: 27 cells/uL (ref 0–200)
Basophils Relative: 0.3 %
Eosinophils Absolute: 107 cells/uL (ref 15–500)
Eosinophils Relative: 1.2 %
HCT: 40.3 % (ref 38.5–50.0)
Hemoglobin: 13.6 g/dL (ref 13.2–17.1)
Lymphs Abs: 2270 cells/uL (ref 850–3900)
MCH: 29.6 pg (ref 27.0–33.0)
MCHC: 33.7 g/dL (ref 32.0–36.0)
MCV: 87.6 fL (ref 80.0–100.0)
MPV: 9.8 fL (ref 7.5–12.5)
Monocytes Relative: 7.8 %
Neutro Abs: 5803 cells/uL (ref 1500–7800)
Neutrophils Relative %: 65.2 %
Platelets: 208 10*3/uL (ref 140–400)
RBC: 4.6 10*6/uL (ref 4.20–5.80)
RDW: 12 % (ref 11.0–15.0)
Total Lymphocyte: 25.5 %
WBC: 8.9 10*3/uL (ref 3.8–10.8)

## 2022-02-01 LAB — MICROALBUMIN / CREATININE URINE RATIO
Creatinine, Urine: 33 mg/dL (ref 20–320)
Microalb Creat Ratio: 12 mcg/mg creat (ref <30)
Microalb, Ur: 0.4 mg/dL

## 2022-02-01 LAB — HEPATITIS C ANTIBODY: Hepatitis C Ab: NONREACTIVE

## 2022-02-01 LAB — LIPID PANEL
Cholesterol: 157 mg/dL (ref <200)
HDL: 30 mg/dL — ABNORMAL LOW (ref >=40)
LDL Cholesterol (Calc): 103 mg/dL (calc) — ABNORMAL HIGH
Non-HDL Cholesterol (Calc): 127 mg/dL (calc) (ref <130)
Total CHOL/HDL Ratio: 5.2 (calc) — ABNORMAL HIGH (ref <5.0)
Triglycerides: 143 mg/dL (ref <150)

## 2022-02-01 LAB — HEMOGLOBIN A1C
Hgb A1c MFr Bld: 8.9 % of total Hgb — ABNORMAL HIGH (ref <5.7)
Mean Plasma Glucose: 209 mg/dL
eAG (mmol/L): 11.6 mmol/L

## 2022-02-01 LAB — NOVEL CORONAVIRUS, NAA: SARS-CoV-2, NAA: NOT DETECTED

## 2022-02-03 ENCOUNTER — Other Ambulatory Visit: Payer: Self-pay | Admitting: Nurse Practitioner

## 2022-02-03 DIAGNOSIS — E119 Type 2 diabetes mellitus without complications: Secondary | ICD-10-CM

## 2022-02-03 MED ORDER — OZEMPIC (0.25 OR 0.5 MG/DOSE) 2 MG/3ML ~~LOC~~ SOPN: 0.2500 mg | PEN_INJECTOR | SUBCUTANEOUS | 0 refills | Status: DC

## 2022-03-25 LAB — HM DIABETES EYE EXAM

## 2022-04-09 ENCOUNTER — Encounter: Payer: Self-pay | Admitting: Nurse Practitioner

## 2022-06-03 NOTE — Progress Notes (Unsigned)
   There were no vitals taken for this visit.   Subjective:    Patient ID: Thomas Stephenson, male    DOB: 03-Jun-1980, 42 y.o.   MRN: [id number]  HPI: Thomas Stephenson is a 42 y.o. male  No chief complaint on file.  DM: He was taking januvia 100 mg daily, glipizide 10 mg BID at last visit his A1C came back at 8.9.  recommended patient start ozempic.  Patient reports ***.  He reports that his blood sugar has been running ***. He also takes lisinopril 5 mg daily.  He denies any polyuria, polyphagia or polydipsia. He is up to date on his foot and eye exam, microalbumin urine.  His A1C today is ***.  Hyperlipidemia: His last LDL was 103 on 01/31/2022.  He is not currently on cholesterol lowering medication.  Discussed lifestyle modification.   The 10-year ASCVD risk score (Arnett DK, et al., 2019) is: 3.9%   Values used to calculate the score:     Age: 24 years     Sex: Male     Is Non-Hispanic African American: No     Diabetic: Yes     Tobacco smoker: No     Systolic Blood Pressure: 0000000 mmHg     Is BP treated: No     HDL Cholesterol: 30 mg/dL     Total Cholesterol: 157 mg/dL    Relevant past medical, surgical, family and social history reviewed and updated as indicated. Interim medical history since our last visit reviewed. Allergies and medications reviewed and updated.  Review of Systems  Constitutional: Negative for fever or weight change.  Respiratory: Negative for cough and shortness of breath.   Cardiovascular: Negative for chest pain or palpitations.  Gastrointestinal: Negative for abdominal pain, no bowel changes.  Musculoskeletal: Negative for gait problem or joint swelling.  Skin: Negative for rash.  Neurological: Negative for dizziness or headache.  No other specific complaints in a complete review of systems (except as listed in HPI above).      Objective:    There were no vitals taken for this visit.  Wt Readings from Last 3 Encounters:  01/31/22 155 lb 9.6 oz (70.6 kg)   05/20/21 151 lb 4.8 oz (68.6 kg)  02/11/21 150 lb (68 kg)    Physical Exam  Constitutional: Patient appears well-developed and well-nourished.  No distress.  HEENT: head atraumatic, normocephalic, pupils equal and reactive to light,  neck supple Cardiovascular: Normal rate, regular rhythm and normal heart sounds.  No murmur heard. No BLE edema. Pulmonary/Chest: Effort normal and breath sounds expiratory wheezing. No respiratory distress. Abdominal: Soft.  There is no tenderness. Psychiatric: Patient has a normal mood and affect. behavior is normal. Judgment and thought content normal.  Diabetic Foot Exam - Simple   No data filed     Results for orders placed or performed in visit on 04/09/22  HM DIABETES EYE EXAM  Result Value Ref Range   HM Diabetic Eye Exam Retinopathy (A) No Retinopathy      Assessment & Plan:   Problem List Items Addressed This Visit   None    Follow up plan: No follow-ups on file.

## 2022-06-05 ENCOUNTER — Ambulatory Visit (INDEPENDENT_AMBULATORY_CARE_PROVIDER_SITE_OTHER): Payer: BC Managed Care – PPO | Admitting: Nurse Practitioner

## 2022-06-05 ENCOUNTER — Other Ambulatory Visit: Payer: Self-pay

## 2022-06-05 VITALS — BP 122/76 | HR 96 | Temp 98.1°F | Resp 18 | Ht 66.0 in | Wt 150.9 lb

## 2022-06-05 DIAGNOSIS — E119 Type 2 diabetes mellitus without complications: Secondary | ICD-10-CM

## 2022-06-05 DIAGNOSIS — E782 Mixed hyperlipidemia: Secondary | ICD-10-CM

## 2022-06-05 DIAGNOSIS — F341 Dysthymic disorder: Secondary | ICD-10-CM | POA: Insufficient documentation

## 2022-06-05 LAB — POCT GLYCOSYLATED HEMOGLOBIN (HGB A1C): Hemoglobin A1C: 8 % — AB (ref 4.0–5.6)

## 2022-06-05 NOTE — Assessment & Plan Note (Addendum)
Continue taking januvia 100 mg daily and glipizide 10 mg BID. Referral placed for nutrition and diabetic education

## 2022-06-05 NOTE — Assessment & Plan Note (Signed)
Information provided for local counselors

## 2022-06-05 NOTE — Assessment & Plan Note (Signed)
Continue working on lifestyle modification.  

## 2022-10-06 ENCOUNTER — Ambulatory Visit: Payer: Commercial Managed Care - PPO | Admitting: Dietician

## 2022-10-06 ENCOUNTER — Ambulatory Visit: Payer: BC Managed Care – PPO | Admitting: Nurse Practitioner

## 2022-10-06 NOTE — Progress Notes (Deleted)
There were no vitals taken for this visit.   Subjective:    Patient ID: Thomas Stephenson, male    DOB: 1980/08/13, 42 y.o.   MRN: [id number]  HPI: Thomas Stephenson is a 42 y.o. male  No chief complaint on file.  Diabetes, Type 2:  -Last A1c 8.0 -Medications: januvia 100 mg daily, glipizide 10 mg  BID, tried to get him started on ozempic but it was too expensive.  -Patient is compliant with the above medications and reports no side effects. *** -Checking BG at home: *** -Fasting home BG: *** -Post-prandial home BG: *** -Highest home BG since last visit: *** -Lowest home BG since last visit: *** -Diet: has been working on diet -Eye exam: utd -Foot exam: utd -Microalbumin: utd -Statin: none -Denies symptoms of hypoglycemia, polyuria, polydipsia, numbness extremities, foot ulcers/trauma. ***   HLD:  -Medications: none -Last lipid panel:  Lipid Panel     Component Value Date/Time   CHOL 157 01/31/2022 0850   TRIG 143 01/31/2022 0850   HDL 30 (L) 01/31/2022 0850   CHOLHDL 5.2 (H) 01/31/2022 0850   VLDL 57.0 (H) 10/04/2019 0920   LDLCALC 103 (H) 01/31/2022 0850   LDLDIRECT 109.0 10/04/2019 0920    The 10-year ASCVD risk score (Arnett DK, et al., 2019) is: 4%   Values used to calculate the score:     Age: 16 years     Sex: Male     Is Non-Hispanic African American: No     Diabetic: Yes     Tobacco smoker: No     Systolic Blood Pressure: 122 mmHg     Is BP treated: No     HDL Cholesterol: 30 mg/dL     Total Cholesterol: 157 mg/dL   His wife was recently diagnosed with stage three cancer of the larynx.  Patient is interested in counseling. Will print out list of therapists in our area.     06/05/2022    8:48 AM 06/05/2022    8:43 AM 01/31/2022    8:12 AM 05/20/2021    8:47 AM 02/11/2021    8:54 AM  Depression screen PHQ 2/9  Decreased Interest 1 1 0 0 0  Down, Depressed, Hopeless 1 1 0 0 0  PHQ - 2 Score 2 2 0 0 0  Altered sleeping 1   0   Tired, decreased energy 1   0    Change in appetite 0   0   Feeling bad or failure about yourself  1   0   Trouble concentrating 0   0   Moving slowly or fidgety/restless 0   0   Suicidal thoughts 0   0   PHQ-9 Score 5   0   Difficult doing work/chores Somewhat difficult   Not difficult at all        06/05/2022    8:48 AM  GAD 7 : Generalized Anxiety Score  Nervous, Anxious, on Edge 1  Control/stop worrying 1  Worry too much - different things 1  Restless 0  Easily annoyed or irritable 1  Afraid - awful might happen 1  Anxiety Difficulty Somewhat difficult     Relevant past medical, surgical, family and social history reviewed and updated as indicated. Interim medical history since our last visit reviewed. Allergies and medications reviewed and updated.  Review of Systems  Constitutional: Negative for fever or weight change.  Respiratory: Negative for cough and shortness of breath.   Cardiovascular: Negative for chest pain or  palpitations.  Gastrointestinal: Negative for abdominal pain, no bowel changes.  Musculoskeletal: Negative for gait problem or joint swelling.  Skin: Negative for rash.  Neurological: Negative for dizziness or headache.  No other specific complaints in a complete review of systems (except as listed in HPI above).      Objective:    There were no vitals taken for this visit.  Wt Readings from Last 3 Encounters:  06/05/22 150 lb 14.4 oz (68.4 kg)  01/31/22 155 lb 9.6 oz (70.6 kg)  05/20/21 151 lb 4.8 oz (68.6 kg)    Physical Exam  Constitutional: Patient appears well-developed and well-nourished.  No distress.  HEENT: head atraumatic, normocephalic, pupils equal and reactive to light,  neck supple Cardiovascular: Normal rate, regular rhythm and normal heart sounds.  No murmur heard. No BLE edema. Pulmonary/Chest: Effort normal and breath sounds expiratory wheezing. No respiratory distress. Abdominal: Soft.  There is no tenderness. Psychiatric: Patient has a normal mood and  affect. behavior is normal. Judgment and thought content normal.   Results for orders placed or performed in visit on 06/05/22  POCT HgB A1C  Result Value Ref Range   Hemoglobin A1C 8.0 (A) 4.0 - 5.6 %   HbA1c POC (<> result, manual entry)     HbA1c, POC (prediabetic range)     HbA1c, POC (controlled diabetic range)        Assessment & Plan:   Problem List Items Addressed This Visit   None     Follow up plan: No follow-ups on file.

## 2023-01-27 ENCOUNTER — Other Ambulatory Visit: Payer: Self-pay | Admitting: Nurse Practitioner

## 2023-01-27 DIAGNOSIS — E119 Type 2 diabetes mellitus without complications: Secondary | ICD-10-CM

## 2023-01-28 ENCOUNTER — Other Ambulatory Visit: Payer: Self-pay | Admitting: Nurse Practitioner

## 2023-01-28 DIAGNOSIS — E119 Type 2 diabetes mellitus without complications: Secondary | ICD-10-CM

## 2023-01-28 NOTE — Telephone Encounter (Signed)
Requested medication (s) are due for refill today: yes  Requested medication (s) are on the active medication list: yes  Last refill:  01/29/22 #180 1 RF  Future visit scheduled: no  Notes to clinic:  called pt and LM on VM to call office to schedule appt /overdue lab work    Requested Prescriptions  Pending Prescriptions Disp Refills   glipiZIDE (GLUCOTROL) 10 MG tablet [Pharmacy Med Name: GLIPIZIDE 10 MG TABLET] 180 tablet 3    Sig: TAKE 1 TABLET (10 MG TOTAL) BY MOUTH TWICE A DAY BEFORE A MEAL     Endocrinology:  Diabetes - Sulfonylureas Failed - 01/27/2023  2:32 AM      Failed - HBA1C is between 0 and 7.9 and within 180 days    Hemoglobin A1C  Date Value Ref Range Status  06/05/2022 8.0 (A) 4.0 - 5.6 % Final   Hgb A1c MFr Bld  Date Value Ref Range Status  01/31/2022 8.9 (H) <5.7 % of total Hgb Final    Comment:    For someone without known diabetes, a hemoglobin A1c value of 6.5% or greater indicates that they may have  diabetes and this should be confirmed with a follow-up  test. . For someone with known diabetes, a value <7% indicates  that their diabetes is well controlled and a value  greater than or equal to 7% indicates suboptimal  control. A1c targets should be individualized based on  duration of diabetes, age, comorbid conditions, and  other considerations. . Currently, no consensus exists regarding use of hemoglobin A1c for diagnosis of diabetes for children. .          Failed - Cr in normal range and within 360 days    Creat  Date Value Ref Range Status  01/31/2022 0.81 0.60 - 1.29 mg/dL Final   Creatinine,U  Date Value Ref Range Status  03/31/2017 232.7 mg/dL Final   Creatinine, Urine  Date Value Ref Range Status  01/31/2022 33 20 - 320 mg/dL Final         Failed - Valid encounter within last 6 months    Recent Outpatient Visits           7 months ago Mixed hyperlipidemia   [facility] Berniece Salines, FNP    12 months ago Type 2 diabetes mellitus without complication, without long-term current use of insulin [facility])   [place] [facility] Della Goo F, FNP   1 year ago Type 2 diabetes mellitus without complication, without long-term current use of insulin [facility])    [facility] Della Goo F, FNP   1 year ago Type 2 diabetes mellitus without complication, without long-term current use of insulin [facility])   [facility] Berniece Salines, [place]

## 2023-01-29 NOTE — Telephone Encounter (Signed)
Requested Prescriptions  Pending Prescriptions Disp Refills   sitaGLIPtin (JANUVIA) 100 MG tablet [Pharmacy Med Name: JANUVIA 100 MG TABLET] 90 tablet 3    Sig: TAKE 1 TABLET BY MOUTH EVERY DAY     Endocrinology:  Diabetes - DPP-4 Inhibitors Failed - 01/28/2023  2:25 AM      Failed - HBA1C is between 0 and 7.9 and within 180 days    Hemoglobin A1C  Date Value Ref Range Status  06/05/2022 8.0 (A) 4.0 - 5.6 % Final   Hgb A1c MFr Bld  Date Value Ref Range Status  01/31/2022 8.9 (H) <5.7 % of total Hgb Final    Comment:    For someone without known diabetes, a hemoglobin A1c value of 6.5% or greater indicates that they may have  diabetes and this should be confirmed with a follow-up  test. . For someone with known diabetes, a value <7% indicates  that their diabetes is well controlled and a value  greater than or equal to 7% indicates suboptimal  control. A1c targets should be individualized based on  duration of diabetes, age, comorbid conditions, and  other considerations. . Currently, no consensus exists regarding use of hemoglobin A1c for diagnosis of diabetes for children. .          Failed - Cr in normal range and within 360 days    Creat  Date Value Ref Range Status  01/31/2022 0.81 0.60 - 1.29 mg/dL Final   Creatinine,U  Date Value Ref Range Status  03/31/2017 232.7 mg/dL Final   Creatinine, Urine  Date Value Ref Range Status  01/31/2022 33 20 - 320 mg/dL Final         Failed - Valid encounter within last 6 months    Recent Outpatient Visits           7 months ago Mixed hyperlipidemia   [facility] Berniece Salines, FNP   12 months ago Type 2 diabetes mellitus without complication, without long-term current use of insulin [facility])   [place] [facility] Della Goo F, FNP   1 year ago Type 2 diabetes mellitus without complication, without long-term current use of insulin [facility])   [facility] Della Goo F, FNP   1 year ago Type 2 diabetes mellitus without complication, without long-term current use of insulin [facility])   [place] [facility] Berniece Salines, FNP       Future Appointments             In 4 days Zane Herald, Rudolpho Sevin, FNP [facility], [facility]

## 2023-02-02 ENCOUNTER — Ambulatory Visit: Payer: BC Managed Care – PPO | Admitting: Nurse Practitioner

## 2023-02-02 ENCOUNTER — Other Ambulatory Visit: Payer: Self-pay

## 2023-02-02 VITALS — BP 118/72 | HR 98 | Temp 97.9°F | Resp 16 | Ht 66.0 in | Wt 144.6 lb

## 2023-02-02 DIAGNOSIS — E119 Type 2 diabetes mellitus without complications: Secondary | ICD-10-CM

## 2023-02-02 DIAGNOSIS — E782 Mixed hyperlipidemia: Secondary | ICD-10-CM | POA: Diagnosis not present

## 2023-02-02 DIAGNOSIS — R197 Diarrhea, unspecified: Secondary | ICD-10-CM

## 2023-02-02 DIAGNOSIS — Z130 Encounter for screening for diseases of the blood and blood-forming organs and certain disorders involving the immune mechanism: Secondary | ICD-10-CM

## 2023-02-02 DIAGNOSIS — Z7984 Long term (current) use of oral hypoglycemic drugs: Secondary | ICD-10-CM | POA: Diagnosis not present

## 2023-02-02 DIAGNOSIS — Z23 Encounter for immunization: Secondary | ICD-10-CM

## 2023-02-02 DIAGNOSIS — M25512 Pain in left shoulder: Secondary | ICD-10-CM

## 2023-02-02 DIAGNOSIS — E1159 Type 2 diabetes mellitus with other circulatory complications: Secondary | ICD-10-CM

## 2023-02-02 NOTE — Assessment & Plan Note (Signed)
Getting labs today,  continue current medications. He does report that in January we will need to switch his medication due to insurance coverage. He will send [place] the letter with alternatives.

## 2023-02-02 NOTE — Progress Notes (Signed)
BP 118/72   Pulse 98   Temp 97.9 F (36.6 C) (Oral)   Resp 16   Ht 5\' 6"  (1.676 m)   Wt 144 lb 9.6 oz (65.6 kg)   SpO2 99%   BMI 23.34 kg/m    Subjective:    Patient ID: Thomas Stephenson, male    DOB: 1980/10/28, 42 y.o.   MRN: [id number]  HPI: Thomas Stephenson is a 42 y.o. male  Chief Complaint  Patient presents with   Diabetes   Hypertension   Shoulder Pain    Radiates to finger tips   Diabetes, Type 2:  -Last A1c 8.0 -Medications: januvia 100 mg daily has been out for 1 day, glipizide 10 mg daily -Patient is compliant with the above medications and reports no side effects.  -Checking BG at home: has not been checking blood sugar -Diet: reduce sugar and processed foods in your diet  -Exercise: recommend 150 min of physical activity weekly   -Eye exam: utd -Foot exam: due -Microalbumin: due -Statin: no -PNA vaccine: no -Denies symptoms of hypoglycemia, polyuria, polydipsia, numbness extremities, foot ulcers/trauma.    HLD:  -Medications: none -Last lipid panel:  Lipid Panel     Component Value Date/Time   CHOL 157 01/31/2022 0850   TRIG 143 01/31/2022 0850   HDL 30 (L) 01/31/2022 0850   CHOLHDL 5.2 (H) 01/31/2022 0850   VLDL 57.0 (H) 10/04/2019 0920   LDLCALC 103 (H) 01/31/2022 0850   LDLDIRECT 109.0 10/04/2019 0920   The 10-year ASCVD risk score (Arnett DK, et al., 2019) is: 3.8%   Values used to calculate the score:     Age: 40 years     Sex: Male     Is Non-Hispanic African American: No     Diabetic: Yes     Tobacco smoker: No     Systolic Blood Pressure: 118 mmHg     Is BP treated: No     HDL Cholesterol: 30 mg/dL     Total Cholesterol: 157 mg/dL    Shoulder pain: he reports that  he has had left shoulder pain for about a couple of months.  He denies any trauma.  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 He says that his first 3 fingers will go numb.  He says that it hurts all the time but the intensity changes. He says sleep has  been difficult to find a comfortable position.  No decrease range of motion.  He says he has not taken anything for the pain.   Diarrhea: patient reports he has been having recurrent diarrhea.  He says that he continues to have gas and diarrhea. He says he was recently on antibiotics for a tooth and that seemed to help.  Denies any blood in his stool. Will get stool cultures.     His wife was recently diagnosed with stage three cancer of the larynx.  Patient is interested in counseling. Will print out list of therapists in our area.     02/02/2023   10:27 AM 02/02/2023   10:23 AM 06/05/2022    8:48 AM 06/05/2022    8:43 AM 01/31/2022    8:12 AM  Depression screen PHQ 2/9  Decreased Interest 0 0 1 1 0  Down, Depressed, Hopeless 0 0 1 1 0  PHQ - 2 Score 0 0 2 2 0  Altered sleeping 0  1    Tired, decreased energy 0  1    Change in appetite  0  0    Feeling bad or failure about yourself  0  1    Trouble concentrating 0  0    Moving slowly or fidgety/restless 0  0    Suicidal thoughts 0  0    PHQ-9 Score 0  5    Difficult doing work/chores Not difficult at all  Somewhat difficult         06/05/2022    8:48 AM  GAD 7 : Generalized Anxiety Score  Nervous, Anxious, on Edge 1  Control/stop worrying 1  Worry too much - different things 1  Restless 0  Easily annoyed or irritable 1  Afraid - awful might happen 1  Anxiety Difficulty Somewhat difficult     Relevant past medical, surgical, family and social history reviewed and updated as indicated. Interim medical history since our last visit reviewed. Allergies and medications reviewed and updated.  Review of Systems  Constitutional: Negative for fever or weight change.  Respiratory: Negative for cough and shortness of breath.   Cardiovascular: Negative for chest pain or palpitations.  Gastrointestinal: Negative for abdominal pain, diarrhea Musculoskeletal: Negative for gait problem or joint swelling. Positive for shoulder pain Skin:  Negative for rash.  Neurological: Negative for dizziness or headache.  No other specific complaints in a complete review of systems (except as listed in HPI above).      Objective:    BP 118/72   Pulse 98   Temp 97.9 F (36.6 C) (Oral)   Resp 16   Ht 5\' 6"  (1.676 m)   Wt 144 lb 9.6 oz (65.6 kg)   SpO2 99%   BMI 23.34 kg/m   Wt Readings from Last 3 Encounters:  02/02/23 144 lb 9.6 oz (65.6 kg)  06/05/22 150 lb 14.4 oz (68.4 kg)  01/31/22 155 lb 9.6 oz (70.6 kg)    Physical Exam  Constitutional: Patient appears well-developed and well-nourished.  No distress.  HEENT: head atraumatic, normocephalic, pupils equal and reactive to light,  neck supple Cardiovascular: Normal rate, regular rhythm and normal heart sounds.  No murmur heard. No BLE edema. Pulmonary/Chest: Effort normal and breath sounds expiratory wheezing. No respiratory distress. Abdominal: Soft.  There is no tenderness. Psychiatric: Patient has a normal mood and affect. behavior is normal. Judgment and thought content normal.   Results for orders placed or performed in visit on 06/05/22  POCT HgB A1C  Result Value Ref Range   Hemoglobin A1C 8.0 (A) 4.0 - 5.6 %   HbA1c POC (<> result, manual entry)     HbA1c, POC (prediabetic range)     HbA1c, POC (controlled diabetic range)        Assessment & Plan:   Problem List Items Addressed This Visit       Endocrine   Type 2 diabetes mellitus without complication, without long-term current use of insulin (HCC) - Primary    Getting labs today,  continue current medications. He does report that in January we will need to switch his medication due to insurance coverage. He will send [place] the letter with alternatives.       Relevant Orders   Microalbumin / creatinine urine ratio   COMPLETE METABOLIC PANEL WITH GFR   Hemoglobin A1c   HM Diabetes Foot Exam (Completed)     Other   Mixed hyperlipidemia    Continue working on lifestyle modification      Relevant Orders    Lipid panel   Other Visit Diagnoses     Screening  for deficiency anemia       Relevant Orders   CBC with Differential/Platelet   Need for influenza vaccination       Relevant Orders   Flu vaccine trivalent PF, 6mos and older(Flulaval,Afluria,Fluarix,Fluzone) (Completed)   Acute pain of left shoulder       referral placed to ortho   Relevant Orders   Ambulatory referral to Orthopedic Surgery   Diarrhea, unspecified type       will get stool cultures   Relevant Orders   Ova and parasite examination   Clostridium difficile culture-fecal   Salmonella/Shigella Cult, Campy EIA and Shiga Toxin reflex   CALPROTECTIN   Stool Giardia/Cryptosporidium   Celiac Disease Panel         Follow up plan: Return in about 4 months (around 06/02/2023) for follow up.

## 2023-02-02 NOTE — Assessment & Plan Note (Signed)
Continue working on lifestyle modification.  

## 2023-02-03 ENCOUNTER — Other Ambulatory Visit: Payer: Self-pay | Admitting: Nurse Practitioner

## 2023-02-03 DIAGNOSIS — E1165 Type 2 diabetes mellitus with hyperglycemia: Secondary | ICD-10-CM

## 2023-02-03 LAB — COMPLETE METABOLIC PANEL WITH GFR
AG Ratio: 1.9 (calc) (ref 1.0–2.5)
ALT: 14 U/L (ref 9–46)
AST: 15 U/L (ref 10–40)
Albumin: 4.7 g/dL (ref 3.6–5.1)
Alkaline phosphatase (APISO): 56 U/L (ref 36–130)
BUN: 14 mg/dL (ref 7–25)
CO2: 27 mmol/L (ref 20–32)
Calcium: 10 mg/dL (ref 8.6–10.3)
Chloride: 104 mmol/L (ref 98–110)
Creat: 0.91 mg/dL (ref 0.60–1.29)
Globulin: 2.5 g/dL (ref 1.9–3.7)
Glucose, Bld: 226 mg/dL — ABNORMAL HIGH (ref 65–99)
Potassium: 5.4 mmol/L — ABNORMAL HIGH (ref 3.5–5.3)
Sodium: 137 mmol/L (ref 135–146)
Total Bilirubin: 0.9 mg/dL (ref 0.2–1.2)
Total Protein: 7.2 g/dL (ref 6.1–8.1)
eGFR: 107 mL/min/{1.73_m2} (ref >=60)

## 2023-02-03 LAB — CBC WITH DIFFERENTIAL/PLATELET
Absolute Lymphocytes: 2018 {cells}/uL (ref 850–3900)
Absolute Monocytes: 460 {cells}/uL (ref 200–950)
Basophils Absolute: 59 {cells}/uL (ref 0–200)
Basophils Relative: 1 %
Eosinophils Absolute: 218 {cells}/uL (ref 15–500)
Eosinophils Relative: 3.7 %
HCT: 42.9 % (ref 38.5–50.0)
Hemoglobin: 14.6 g/dL (ref 13.2–17.1)
MCH: 29.8 pg (ref 27.0–33.0)
MCHC: 34 g/dL (ref 32.0–36.0)
MCV: 87.6 fL (ref 80.0–100.0)
MPV: 10.4 fL (ref 7.5–12.5)
Monocytes Relative: 7.8 %
Neutro Abs: 3145 {cells}/uL (ref 1500–7800)
Neutrophils Relative %: 53.3 %
Platelets: 226 10*3/uL (ref 140–400)
RBC: 4.9 10*6/uL (ref 4.20–5.80)
RDW: 11.8 % (ref 11.0–15.0)
Total Lymphocyte: 34.2 %
WBC: 5.9 10*3/uL (ref 3.8–10.8)

## 2023-02-03 LAB — CELIAC DISEASE PANEL
(tTG) Ab, IgA: <1 U/mL
(tTG) Ab, IgG: <1 U/mL
Gliadin IgA: <1 U/mL
Gliadin IgG: <1 U/mL
Immunoglobulin A: 577 mg/dL — ABNORMAL HIGH (ref 47–310)

## 2023-02-03 LAB — LIPID PANEL
Cholesterol: 176 mg/dL (ref <200)
HDL: 35 mg/dL — ABNORMAL LOW (ref >=40)
LDL Cholesterol (Calc): 120 mg/dL — ABNORMAL HIGH
Non-HDL Cholesterol (Calc): 141 mg/dL — ABNORMAL HIGH (ref <130)
Total CHOL/HDL Ratio: 5 (calc) — ABNORMAL HIGH (ref <5.0)
Triglycerides: 105 mg/dL (ref <150)

## 2023-02-03 LAB — MICROALBUMIN / CREATININE URINE RATIO
Creatinine, Urine: 186 mg/dL (ref 20–320)
Microalb Creat Ratio: 4 mg/g{creat} (ref <30)
Microalb, Ur: 0.8 mg/dL

## 2023-02-03 LAB — HEMOGLOBIN A1C
Hgb A1c MFr Bld: 9.6 %{Hb} — ABNORMAL HIGH (ref <5.7)
Mean Plasma Glucose: 229 mg/dL
eAG (mmol/L): 12.7 mmol/L

## 2023-02-03 MED ORDER — TOUJEO MAX SOLOSTAR 300 UNIT/ML ~~LOC~~ SOPN: 10.0000 [IU] | PEN_INJECTOR | Freq: Every day | SUBCUTANEOUS | 3 refills | Status: DC

## 2023-02-03 MED ORDER — INSULIN PEN NEEDLE 32G X 6 MM MISC: 1.0000 | Freq: Every day | 1 refills | Status: DC

## 2023-02-03 MED ORDER — TIRZEPATIDE 2.5 MG/0.5ML ~~LOC~~ SOAJ: 2.5000 mg | SUBCUTANEOUS | 0 refills | Status: DC

## 2023-02-03 MED ORDER — SAXAGLIPTIN HCL 5 MG PO TABS: 5.0000 mg | ORAL_TABLET | Freq: Every day | ORAL | 1 refills | Status: DC

## 2023-02-03 MED ORDER — GVOKE HYPOPEN 2-PACK 1 MG/0.2ML ~~LOC~~ SOAJ: 1.0000 mg | SUBCUTANEOUS | 1 refills | Status: DC | PRN

## 2023-02-04 ENCOUNTER — Encounter: Payer: Self-pay | Admitting: Nurse Practitioner

## 2023-02-04 ENCOUNTER — Other Ambulatory Visit: Payer: Self-pay | Admitting: Nurse Practitioner

## 2023-02-04 DIAGNOSIS — E1165 Type 2 diabetes mellitus with hyperglycemia: Secondary | ICD-10-CM

## 2023-02-04 MED ORDER — FREESTYLE LIBRE 3 SENSOR MISC: 1.0000 | 11 refills | Status: DC

## 2023-02-04 MED ORDER — DEXCOM G7 SENSOR MISC: 1.0000 | 10 refills | Status: DC

## 2023-02-04 NOTE — Telephone Encounter (Signed)
Requested by interface surescripts. Medication changed . Medication discontinued 02/03/23.  Requested Prescriptions  Refused Prescriptions Disp Refills   MOUNJARO 2.5 MG/0.5ML Pen [Pharmacy Med Name: MOUNJARO 2.5 MG/0.5 ML PEN]  0    Sig: INJECT 2.5 MG SUBCUTANEOUSLY WEEKLY     Off-Protocol Failed - 02/03/2023  8:07 AM      Failed - Medication not assigned to a protocol, review manually.      Passed - Valid encounter within last 12 months    Recent Outpatient Visits           2 days ago Type 2 diabetes mellitus without complication, without long-term current use of insulin [facility])   [place] [facility] Berniece Salines, FNP   8 months ago Mixed hyperlipidemia   [facility] Della Goo F, FNP   1 year ago Type 2 diabetes mellitus without complication, without long-term current use of insulin [facility])   [place] [facility] Della Goo F, FNP   1 year ago Type 2 diabetes mellitus without complication, without long-term current use of insulin [facility])   [place] [facility] Della Goo F, FNP   1 year ago Type 2 diabetes mellitus without complication, without long-term current use of insulin [facility])    [facility] Berniece Salines, FNP       Future Appointments             In 3 months Zane Herald, Rudolpho Sevin, FNP [facility], [facility]

## 2023-02-05 ENCOUNTER — Other Ambulatory Visit: Payer: Self-pay | Admitting: Nurse Practitioner

## 2023-02-05 DIAGNOSIS — R768 Other specified abnormal immunological findings in serum: Secondary | ICD-10-CM

## 2023-02-16 ENCOUNTER — Other Ambulatory Visit: Payer: Self-pay | Admitting: Nurse Practitioner

## 2023-02-16 DIAGNOSIS — E1165 Type 2 diabetes mellitus with hyperglycemia: Secondary | ICD-10-CM

## 2023-02-16 MED ORDER — DEXCOM G7 SENSOR MISC: 10 refills | Status: DC

## 2023-02-18 ENCOUNTER — Ambulatory Visit: Payer: Self-pay

## 2023-02-18 NOTE — Telephone Encounter (Signed)
Unable to reach patient, routing to the provider for review.

## 2023-02-18 NOTE — Telephone Encounter (Signed)
Patient called, left VM to return the call to the office to speak to the NT.     Summary: Blood Sugar/Medication Concerns   Pt is calling in because he says he takes glipiZIDE (GLUCOTROL) 10 MG tablet [[id number]], saxagliptin HCl (ONGLYZA) 5 MG TABS tablet [[id number]], and lisinopril (ZESTRIL) 5 MG tablet [[id number]] and he has been noticing his blood sugar at night spikes up to the 200's and lower 300's and then drops low in the mornings. 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 Pt is requesting a change in dosage to prevent the constant spiking and lowering

## 2023-02-18 NOTE — Telephone Encounter (Signed)
Message from [place] F sent at 02/18/2023  3:00 PM EST  Summary: Blood Sugar/Medication Concerns   Pt is calling in because he says he takes glipiZIDE (GLUCOTROL) 10 MG tablet [[id number]], saxagliptin HCl (ONGLYZA) 5 MG TABS tablet [[id number]], and lisinopril (ZESTRIL) 5 MG tablet [[id number]] and he has been noticing his blood sugar at night spikes up to the 200's and lower 300's and then drops low in the mornings. 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 Pt is requesting a change in dosage to prevent the constant spiking and lowering          Called pt and LM on VM to call back to discuss sx.

## 2023-02-19 NOTE — Telephone Encounter (Signed)
  Chief Complaint: BS fluctuates Symptoms: Jittery when low during day while he is sleeping, works night shift. Frequency: Since starting Insulin and Saxaglipitin. Pertinent Negatives: Patient denies symptoms presently Disposition: [] ED /[] Urgent Care (no appt availability in office) / [] Appointment(In office/virtual)/ []  [facility]/ [] Home Care/ [] Refused Recommended Disposition /[] [place] [facility]/ [x]  Follow-up with PCP Additional Notes:  Pt reports he works night shift. Takes Insulin and Glipizide before work, eats 1/2 hr after doses. States BS in 200's at night while working. After night shift, takes Glipizide Saxagliptin and lisinopril. Goes to bed at noon and gets alarm BS low. "Has been 56 lately." Feels lightheaded and jittery when occurs. Pt is questioning if he should change his routine as he works nights. States may leave VM or advise in MyChart. Please advise.  Reason for Disposition  [1] Caller has URGENT medicine question about med that PCP or specialist prescribed AND [2] triager unable to answer question  Answer Assessment - Initial Assessment Questions 1. NAME of MEDICINE: "What medicine(s) are you calling about?"     DM meds, 2. QUESTION: "What is your question?" (e.g., double dose of medicine, side effect)     Change routine?  Protocols used: Medication Question Call-A-AH

## 2023-02-19 NOTE — Telephone Encounter (Signed)
Patient is calling back:  insulin glargine, 2 Unit Dial, (TOUJEO MAX SOLOSTAR) 300 UNIT/ML Solostar Pen - 12 units- one time daily- 9:30-9:45 pm- patient works third shift Glipizide 10 mg  2 times daily - 8-8:30 am, 9:30 pm with insulin saxagliptin HCl (ONGLYZA) 5 MG TABS tablet -am with morning medictaion

## 2023-02-25 DIAGNOSIS — M5412 Radiculopathy, cervical region: Secondary | ICD-10-CM | POA: Diagnosis not present

## 2023-03-05 DIAGNOSIS — M5412 Radiculopathy, cervical region: Secondary | ICD-10-CM | POA: Diagnosis not present

## 2023-03-24 DIAGNOSIS — M5412 Radiculopathy, cervical region: Secondary | ICD-10-CM | POA: Diagnosis not present

## 2023-03-31 DIAGNOSIS — M5412 Radiculopathy, cervical region: Secondary | ICD-10-CM | POA: Diagnosis not present

## 2023-04-15 DIAGNOSIS — M5412 Radiculopathy, cervical region: Secondary | ICD-10-CM | POA: Diagnosis not present

## 2023-04-24 ENCOUNTER — Other Ambulatory Visit: Payer: Self-pay | Admitting: Nurse Practitioner

## 2023-04-24 DIAGNOSIS — E119 Type 2 diabetes mellitus without complications: Secondary | ICD-10-CM

## 2023-04-27 NOTE — Telephone Encounter (Signed)
 Requested Prescriptions  Pending Prescriptions Disp Refills   glipiZIDE  (GLUCOTROL ) 10 MG tablet [Pharmacy Med Name: GLIPIZIDE  10 MG TABLET] 180 tablet 0    Sig: TAKE 1 TABLET (10 MG TOTAL) BY MOUTH TWICE A DAY BEFORE A MEAL     Endocrinology:  Diabetes - Sulfonylureas Failed - 04/27/2023  8:07 AM      Failed - HBA1C is between 0 and 7.9 and within 180 days    Hgb A1c MFr Bld  Date Value Ref Range Status  02/02/2023 9.6 (H) <5.7 % of total Hgb Final    Comment:    For someone without known diabetes, a hemoglobin A1c value of 6.5% or greater indicates that they may have  diabetes and this should be confirmed with a follow-up  test. . For someone with known diabetes, a value <7% indicates  that their diabetes is well controlled and a value  greater than or equal to 7% indicates suboptimal  control. A1c targets should be individualized based on  duration of diabetes, age, comorbid conditions, and  other considerations. . Currently, no consensus exists regarding use of hemoglobin A1c for diagnosis of diabetes for children. .          Passed - Cr in normal range and within 360 days    Creat  Date Value Ref Range Status  02/02/2023 0.91 0.60 - 1.29 mg/dL Final   Creatinine,U  Date Value Ref Range Status  03/31/2017 232.7 mg/dL Final   Creatinine, Urine  Date Value Ref Range Status  02/02/2023 186 20 - 320 mg/dL Final         Passed - Valid encounter within last 6 months    Recent Outpatient Visits           2 months ago Type 2 diabetes mellitus without complication, without long-term current use of insulin  [facility])   [place] [facility] Quinton Buckler, FNP   10 months ago Mixed hyperlipidemia   [facility] Donny Gall F, FNP   1 year ago Type 2 diabetes mellitus without complication, without long-term current use of insulin  [facility])   [place] [facility] Donny Gall F, FNP   1 year ago Type 2 diabetes  mellitus without complication, without long-term current use of insulin  [facility])   [place] [facility] Donny Gall F, FNP   2 years ago Type 2 diabetes mellitus without complication, without long-term current use of insulin  [facility])   [facility] [place] [facility] Quinton Buckler, FNP       Future Appointments             In 1 month Abram Hoguet, Monalisa Angles, FNP [facility], [facility]

## 2023-04-28 ENCOUNTER — Other Ambulatory Visit: Payer: Self-pay | Admitting: Nurse Practitioner

## 2023-04-28 DIAGNOSIS — E119 Type 2 diabetes mellitus without complications: Secondary | ICD-10-CM

## 2023-04-28 NOTE — Telephone Encounter (Signed)
Requested Prescriptions  Pending Prescriptions Disp Refills   lisinopril (ZESTRIL) 5 MG tablet [Pharmacy Med Name: LISINOPRIL 5 MG TABLET] 90 tablet 3    Sig: TAKE 1 TABLET (5 MG TOTAL) BY MOUTH DAILY.     Cardiovascular:  ACE Inhibitors Failed - 04/28/2023  4:50 PM      Failed - K in normal range and within 180 days    Potassium  Date Value Ref Range Status  02/02/2023 5.4 (H) 3.5 - 5.3 mmol/L Final         Passed - Cr in normal range and within 180 days    Creat  Date Value Ref Range Status  02/02/2023 0.91 0.60 - 1.29 mg/dL Final   Creatinine,U  Date Value Ref Range Status  03/31/2017 232.7 mg/dL Final   Creatinine, Urine  Date Value Ref Range Status  02/02/2023 186 20 - 320 mg/dL Final         Passed - Patient is not pregnant      Passed - Last BP in normal range    BP Readings from Last 1 Encounters:  02/02/23 118/72         Passed - Valid encounter within last 6 months    Recent Outpatient Visits           2 months ago Type 2 diabetes mellitus without complication, without long-term current use of insulin [facility])   [place] [facility] Berniece Salines, FNP   10 months ago Mixed hyperlipidemia   [place] [facility] Della Goo F, FNP   1 year ago Type 2 diabetes mellitus without complication, without long-term current use of insulin [facility])   [place] [facility] Della Goo F, FNP   1 year ago Type 2 diabetes mellitus without complication, without long-term current use of insulin [facility])   [place] [facility] Della Goo F, FNP   2 years ago Type 2 diabetes mellitus without complication, without long-term current use of insulin [facility])   [place] [facility] Berniece Salines, FNP       Future Appointments             In 1 month Zane Herald, Rudolpho Sevin, FNP [facility], [facility]

## 2023-05-19 ENCOUNTER — Telehealth: Payer: Self-pay

## 2023-05-19 NOTE — Telephone Encounter (Signed)
 Patient was identified as falling into the [place] Measure - Diabetes.   Patient was: Appointment scheduled with primary care provider in the next 30 days.

## 2023-06-02 ENCOUNTER — Ambulatory Visit: Payer: BC Managed Care – PPO | Admitting: Nurse Practitioner

## 2023-06-04 ENCOUNTER — Ambulatory Visit: Admitting: Nurse Practitioner

## 2023-06-04 NOTE — Progress Notes (Deleted)
 There were no vitals taken for this visit.   Subjective:    Patient ID: Thomas Stephenson, male    DOB: 1980/12/03, 43 y.o.   MRN: [id number]  HPI: Thomas Stephenson is a 43 y.o. male  No chief complaint on file.   Discussed the use of AI scribe software for clinical note transcription with the patient, who gave verbal consent to proceed.  History of Present Illness           02/02/2023   10:27 AM 02/02/2023   10:23 AM 06/05/2022    8:48 AM  Depression screen PHQ 2/9  Decreased Interest 0 0 1  Down, Depressed, Hopeless 0 0 1  PHQ - 2 Score 0 0 2  Altered sleeping 0  1  Tired, decreased energy 0  1  Change in appetite 0  0  Feeling bad or failure about yourself  0  1  Trouble concentrating 0  0  Moving slowly or fidgety/restless 0  0  Suicidal thoughts 0  0  PHQ-9 Score 0  5  Difficult doing work/chores Not difficult at all  Somewhat difficult    Relevant past medical, surgical, family and social history reviewed and updated as indicated. Interim medical history since our last visit reviewed. Allergies and medications reviewed and updated.  Review of Systems  Per HPI unless specifically indicated above     Objective:    There were no vitals taken for this visit.  {Vitals History (Optional):23777} Wt Readings from Last 3 Encounters:  02/02/23 144 lb 9.6 oz (65.6 kg)  06/05/22 150 lb 14.4 oz (68.4 kg)  01/31/22 155 lb 9.6 oz (70.6 kg)    Physical Exam  Results for orders placed or performed in visit on 02/02/23  Microalbumin / creatinine urine ratio   Collection Time: 02/02/23 11:03 AM  Result Value Ref Range   Creatinine, Urine 186 20 - 320 mg/dL   Microalb, Ur 0.8 mg/dL   Microalb Creat Ratio 4 <30 mg/g creat  CBC with Differential/Platelet   Collection Time: 02/02/23 11:03 AM  Result Value Ref Range   WBC 5.9 3.8 - 10.8 Thousand/uL   RBC 4.90 4.20 - 5.80 Million/uL   Hemoglobin 14.6 13.2 - 17.1 g/dL   HCT 46.9 62.9 - 52.8 %   MCV 87.6 80.0 - 100.0 fL   MCH  29.8 27.0 - 33.0 pg   MCHC 34.0 32.0 - 36.0 g/dL   RDW 41.3 24.4 - 01.0 %   Platelets 226 140 - 400 Thousand/uL   MPV 10.4 7.5 - 12.5 fL   Neutro Abs 3,145 1,500 - 7,800 cells/uL   Absolute Lymphocytes 2,018 850 - 3,900 cells/uL   Absolute Monocytes 460 200 - 950 cells/uL   Eosinophils Absolute 218 15 - 500 cells/uL   Basophils Absolute 59 0 - 200 cells/uL   Neutrophils Relative % 53.3 %   Total Lymphocyte 34.2 %   Monocytes Relative 7.8 %   Eosinophils Relative 3.7 %   Basophils Relative 1.0 %  COMPLETE METABOLIC PANEL WITH GFR   Collection Time: 02/02/23 11:03 AM  Result Value Ref Range   Glucose, Bld 226 (H) 65 - 99 mg/dL   BUN 14 7 - 25 mg/dL   Creat 2.72 5.36 - 6.44 mg/dL   eGFR 034 > OR = 60 VQ/QVZ/5.63O7   BUN/Creatinine Ratio SEE NOTE: 6 - 22 (calc)   Sodium 137 135 - 146 mmol/L   Potassium 5.4 (H) 3.5 - 5.3 mmol/L   Chloride 104 98 -  110 mmol/L   CO2 27 20 - 32 mmol/L   Calcium 10.0 8.6 - 10.3 mg/dL   Total Protein 7.2 6.1 - 8.1 g/dL   Albumin 4.7 3.6 - 5.1 g/dL   Globulin 2.5 1.9 - 3.7 g/dL (calc)   AG Ratio 1.9 1.0 - 2.5 (calc)   Total Bilirubin 0.9 0.2 - 1.2 mg/dL   Alkaline phosphatase (APISO) 56 36 - 130 U/L   AST 15 10 - 40 U/L   ALT 14 9 - 46 U/L  Lipid panel   Collection Time: 02/02/23 11:03 AM  Result Value Ref Range   Cholesterol 176 <200 mg/dL   HDL 35 (L) > OR = 40 mg/dL   Triglycerides 782 <956 mg/dL   LDL Cholesterol (Calc) 120 (H) mg/dL (calc)   Total CHOL/HDL Ratio 5.0 (H) <5.0 (calc)   Non-HDL Cholesterol (Calc) 141 (H) <130 mg/dL (calc)  Hemoglobin O1H   Collection Time: 02/02/23 11:03 AM  Result Value Ref Range   Hgb A1c MFr Bld 9.6 (H) <5.7 % of total Hgb   Mean Plasma Glucose 229 mg/dL   eAG (mmol/L) 08.6 mmol/L  Celiac Disease Panel   Collection Time: 02/02/23 11:03 AM  Result Value Ref Range   Immunoglobulin A 577 (H) 47 - 310 mg/dL   Gliadin IgA <5.7 U/mL   Gliadin IgG <1.0 U/mL   (tTG) Ab, IgG <1.0 U/mL   (tTG) Ab, IgA <1.0  U/mL   {Labs (Optional):23779}    Assessment & Plan:   Problem List Items Addressed This Visit   None    Assessment and Plan             Follow up plan: No follow-ups on file.

## 2023-06-09 ENCOUNTER — Other Ambulatory Visit: Payer: Self-pay

## 2023-06-09 ENCOUNTER — Ambulatory Visit (INDEPENDENT_AMBULATORY_CARE_PROVIDER_SITE_OTHER): Admitting: Nurse Practitioner

## 2023-06-09 VITALS — BP 120/72 | HR 90 | Temp 98.2°F | Resp 16 | Ht 66.0 in | Wt 152.9 lb

## 2023-06-09 DIAGNOSIS — E782 Mixed hyperlipidemia: Secondary | ICD-10-CM

## 2023-06-09 DIAGNOSIS — Z794 Long term (current) use of insulin: Secondary | ICD-10-CM

## 2023-06-09 DIAGNOSIS — E1165 Type 2 diabetes mellitus with hyperglycemia: Secondary | ICD-10-CM

## 2023-06-09 DIAGNOSIS — E119 Type 2 diabetes mellitus without complications: Secondary | ICD-10-CM | POA: Diagnosis not present

## 2023-06-09 LAB — POCT GLYCOSYLATED HEMOGLOBIN (HGB A1C): Hemoglobin A1C: 5.6 % (ref 4.0–5.6)

## 2023-06-09 MED ORDER — DEXCOM G7 SENSOR MISC: 10 refills | Status: DC

## 2023-06-09 NOTE — Progress Notes (Addendum)
 BP 120/72 (Cuff Size: Large)   Pulse 90   Temp 98.2 F (36.8 C) (Oral)   Resp 16   Ht 5\' 6"  (1.676 m)   Wt 152 lb 14.4 oz (69.4 kg)   SpO2 98%   BMI 24.68 kg/m    Subjective:    Patient ID: Thomas Stephenson, male    DOB: March 24, 1980, 43 y.o.   MRN: [id number]  HPI: Thomas Stephenson is a 43 y.o. male  Chief Complaint  Patient presents with   Medical Management of Chronic Issues    4 month follow up    Discussed the use of AI scribe software for clinical note transcription with the patient, who gave verbal consent to proceed.  History of Present Illness Thomas Stephenson is a 43 year old male with type 2 diabetes and hyperlipidemia who presents for follow-up.  He is managing type 2 diabetes and hyperlipidemia, with recent spikes in blood sugar levels. He had previously stopped taking glipizide but resumed it. Currently, he is on glipizide 10 mg twice daily, saxagliptin 5 mg daily, and 16 units of Toujeo insulin. His last A1c was 9.6% on February 02, 2023, and today it is 5.6%. He attributes this improvement to dietary changes, specifically reducing carbohydrates, starches, and sugars.  He is experiencing issues with obtaining Dexcom sensors due to pharmacy errors, leading to an inconsistent supply.  He has been experiencing sinus issues and allergies for the past few years, which he has not been treating with medication. No symptoms of 'the crud' are reported.  He has a history of smoking for 20 years, consuming two and a half to three packs of [place] 100s daily.         06/09/2023    7:40 AM 02/02/2023   10:27 AM 02/02/2023   10:23 AM  Depression screen PHQ 2/9  Decreased Interest 0 0 0  Down, Depressed, Hopeless 0 0 0  PHQ - 2 Score 0 0 0  Altered sleeping  0   Tired, decreased energy  0   Change in appetite  0   Feeling bad or failure about yourself   0   Trouble concentrating  0   Moving slowly or fidgety/restless  0   Suicidal thoughts  0   PHQ-9 Score  0   Difficult  doing work/chores  Not difficult at all     Relevant past medical, surgical, family and social history reviewed and updated as indicated. Interim medical history since our last visit reviewed. Allergies and medications reviewed and updated.  Review of Systems  Constitutional: Negative for fever or weight change.  Respiratory: Negative for cough and shortness of breath.   Cardiovascular: Negative for chest pain or palpitations.  Gastrointestinal: Negative for abdominal pain, no bowel changes.  Musculoskeletal: Negative for gait problem or joint swelling.  Skin: Negative for rash.  Neurological: Negative for dizziness or headache.  No other specific complaints in a complete review of systems (except as listed in HPI above).      Objective:    BP 120/72 (Cuff Size: Large)   Pulse 90   Temp 98.2 F (36.8 C) (Oral)   Resp 16   Ht 5\' 6"  (1.676 m)   Wt 152 lb 14.4 oz (69.4 kg)   SpO2 98%   BMI 24.68 kg/m    Wt Readings from Last 3 Encounters:  06/09/23 152 lb 14.4 oz (69.4 kg)  02/02/23 144 lb 9.6 oz (65.6 kg)  06/05/22 150 lb 14.4 oz (68.4 kg)  Physical Exam Physical Exam GENERAL: Alert, cooperative, well developed, no acute distress. HEENT: Normocephalic, normal oropharynx, moist mucous membranes. CHEST: Clear to auscultation bilaterally, no wheezes, rhonchi, or crackles. CARDIOVASCULAR: Normal heart rate and rhythm, S1 and S2 normal without murmurs. ABDOMEN: Soft, non-tender, non-distended, without organomegaly, normal bowel sounds. EXTREMITIES: No cyanosis or edema. NEUROLOGICAL: Cranial nerves grossly intact, moves all extremities without gross motor or sensory deficit.   Results for orders placed or performed in visit on 06/09/23  POCT HgB A1C   Collection Time: 06/09/23  7:45 AM  Result Value Ref Range   Hemoglobin A1C 5.6 4.0 - 5.6 %   HbA1c POC (<> result, manual entry)     HbA1c, POC (prediabetic range)     HbA1c, POC (controlled diabetic range)          Assessment & Plan:   Problem List Items Addressed This Visit       Endocrine   Type 2 diabetes mellitus without complication, without long-term current use of insulin (HCC) - Primary   Relevant Medications   Continuous Glucose Sensor (DEXCOM G7 SENSOR) MISC     Other   Mixed hyperlipidemia   Other Visit Diagnoses       Type 2 diabetes mellitus with hyperglycemia, without long-term current use of insulin (HCC)       Relevant Medications   Continuous Glucose Sensor (DEXCOM G7 SENSOR) MISC        Assessment and Plan Assessment & Plan Type 2 Diabetes Mellitus Type 2 diabetes mellitus with significant improvement in glycemic control. Previous A1c was 9.6% in November 2024, now reduced to 5.6%. Reports occasional blood glucose spikes and has resumed taking glipizide twice daily along with saxagliptin and 16 units of insulin. Dietary modifications with reduced carbohydrate intake have contributed to improved glycemic control. Current medication regimen is effective in maintaining target A1c levels. - Continue glipizide 10 mg twice daily. - Continue saxagliptin 5 mg daily. - Continue Toujeo insulin 16 units daily. - Encourage continued dietary modifications with reduced carbohydrate intake. - Send prescription for Dexcom sensors to [place].  Hyperlipidemia Continue lifestyle modification  Allergic Rhinitis Symptoms consistent with allergic rhinitis, including nasal congestion and sinus issues. No prior history of allergies until a few years ago and currently not taking any medication for these symptoms. Recommended over-the-counter allergy medication and nasal spray to alleviate symptoms. - Recommend over-the-counter allergy medication such as Claritin or Zyrtec. - Recommend Flonase nasal spray to help with nasal congestion.        Follow up plan: Return in about 4 months (around 10/09/2023) for follow up.

## 2023-06-10 ENCOUNTER — Encounter: Payer: Self-pay | Admitting: Nurse Practitioner

## 2023-06-16 ENCOUNTER — Other Ambulatory Visit: Payer: Self-pay | Admitting: Emergency Medicine

## 2023-06-16 ENCOUNTER — Telehealth: Payer: Self-pay | Admitting: Nurse Practitioner

## 2023-06-16 DIAGNOSIS — E1165 Type 2 diabetes mellitus with hyperglycemia: Secondary | ICD-10-CM

## 2023-06-16 NOTE — Telephone Encounter (Signed)
 Refill from [facility] (DEXCOM G6 SENSOR) MISC

## 2023-06-16 NOTE — Telephone Encounter (Signed)
 Order sent to [facility] for refil

## 2023-06-17 MED ORDER — DEXCOM G7 SENSOR MISC: 10 refills | Status: DC

## 2023-06-30 ENCOUNTER — Telehealth: Payer: Self-pay | Admitting: Nurse Practitioner

## 2023-06-30 DIAGNOSIS — E1165 Type 2 diabetes mellitus with hyperglycemia: Secondary | ICD-10-CM

## 2023-06-30 MED ORDER — DEXCOM G7 SENSOR MISC: 10 refills | Status: DC

## 2023-06-30 NOTE — Telephone Encounter (Signed)
 Refill from [facility]  Dexcom G-6 Sensor

## 2023-07-30 ENCOUNTER — Other Ambulatory Visit: Payer: Self-pay | Admitting: Nurse Practitioner

## 2023-07-30 DIAGNOSIS — E119 Type 2 diabetes mellitus without complications: Secondary | ICD-10-CM

## 2023-07-30 DIAGNOSIS — E1165 Type 2 diabetes mellitus with hyperglycemia: Secondary | ICD-10-CM

## 2023-07-31 NOTE — Telephone Encounter (Signed)
 Requested Prescriptions  Pending Prescriptions Disp Refills   glipiZIDE  (GLUCOTROL ) 10 MG tablet [Pharmacy Med Name: GLIPIZIDE  10 MG TABLET] 180 tablet 0    Sig: TAKE 1 TABLET (10 MG TOTAL) BY MOUTH TWICE A DAY BEFORE A MEAL     Endocrinology:  Diabetes - Sulfonylureas Passed - 07/31/2023 12:50 PM      Passed - HBA1C is between 0 and 7.9 and within 180 days    Hemoglobin A1C  Date Value Ref Range Status  06/09/2023 5.6 4.0 - 5.6 % Final   Hgb A1c MFr Bld  Date Value Ref Range Status  02/02/2023 9.6 (H) <5.7 % of total Hgb Final    Comment:    For someone without known diabetes, a hemoglobin A1c value of 6.5% or greater indicates that they may have  diabetes and this should be confirmed with a follow-up  test. . For someone with known diabetes, a value <7% indicates  that their diabetes is well controlled and a value  greater than or equal to 7% indicates suboptimal  control. A1c targets should be individualized based on  duration of diabetes, age, comorbid conditions, and  other considerations. . Currently, no consensus exists regarding use of hemoglobin A1c for diagnosis of diabetes for children. .          Passed - Cr in normal range and within 360 days    Creat  Date Value Ref Range Status  02/02/2023 0.91 0.60 - 1.29 mg/dL Final   Creatinine,U  Date Value Ref Range Status  03/31/2017 232.7 mg/dL Final   Creatinine, Urine  Date Value Ref Range Status  02/02/2023 186 20 - 320 mg/dL Final         Passed - Valid encounter within last 6 months    Recent Outpatient Visits           1 month ago Type 2 diabetes mellitus without complication, with long-term current use of insulin  [facility])   [facility] Quinton Buckler, FNP       Future Appointments             In 2 months Abram Hoguet, Monalisa Angles, FNP [facility], [facility]

## 2023-07-31 NOTE — Telephone Encounter (Signed)
 Requested Prescriptions  Pending Prescriptions Disp Refills   saxagliptin  HCl (ONGLYZA) 5 MG TABS tablet [Pharmacy Med Name: SAXAGLIPTIN  HCL 5 MG TABLET] 90 tablet 0    Sig: TAKE 1 TABLET (5 MG TOTAL) BY MOUTH DAILY.     Endocrinology:  Diabetes - DPP-4 Inhibitors Passed - 07/31/2023 11:42 AM      Passed - HBA1C is between 0 and 7.9 and within 180 days    Hemoglobin A1C  Date Value Ref Range Status  06/09/2023 5.6 4.0 - 5.6 % Final   Hgb A1c MFr Bld  Date Value Ref Range Status  02/02/2023 9.6 (H) <5.7 % of total Hgb Final    Comment:    For someone without known diabetes, a hemoglobin A1c value of 6.5% or greater indicates that they may have  diabetes and this should be confirmed with a follow-up  test. . For someone with known diabetes, a value <7% indicates  that their diabetes is well controlled and a value  greater than or equal to 7% indicates suboptimal  control. A1c targets should be individualized based on  duration of diabetes, age, comorbid conditions, and  other considerations. . Currently, no consensus exists regarding use of hemoglobin A1c for diagnosis of diabetes for children. .          Passed - Cr in normal range and within 360 days    Creat  Date Value Ref Range Status  02/02/2023 0.91 0.60 - 1.29 mg/dL Final   Creatinine,U  Date Value Ref Range Status  03/31/2017 232.7 mg/dL Final   Creatinine, Urine  Date Value Ref Range Status  02/02/2023 186 20 - 320 mg/dL Final         Passed - Valid encounter within last 6 months    Recent Outpatient Visits           1 month ago Type 2 diabetes mellitus without complication, with long-term current use of insulin  [facility])   [facility] Quinton Buckler, FNP       Future Appointments             In 2 months Abram Hoguet, Monalisa Angles, FNP [facility], [facility]

## 2023-08-03 ENCOUNTER — Other Ambulatory Visit: Payer: Self-pay | Admitting: Nurse Practitioner

## 2023-08-03 DIAGNOSIS — E1165 Type 2 diabetes mellitus with hyperglycemia: Secondary | ICD-10-CM

## 2023-08-05 NOTE — Telephone Encounter (Signed)
 Requested medication (s) are due for refill today:   See pharmacy note   Needing an alternative.   Not covered.  Requested medication (s) are on the active medication list:     Future visit scheduled:   Yes 7/28   LOV 06/09/2023   Last ordered: Phar needs alternative   Requested Prescriptions  Pending Prescriptions Disp Refills   Glucagon , rDNA, (GLUCAGON  EMERGENCY) 1 MG KIT [Pharmacy Med Name: GLUCAGON  1 MG EMERGENCY KIT]  0     Off-Protocol Failed - 08/05/2023  1:36 PM      Failed - Medication not assigned to a protocol, review manually.      Passed - Valid encounter within last 12 months    Recent Outpatient Visits           1 month ago Type 2 diabetes mellitus without complication, with long-term current use of insulin  [facility])   [facility] Quinton Buckler, FNP       Future Appointments             In 2 months Abram Hoguet, Monalisa Angles, FNP [facility], [facility]

## 2023-08-05 NOTE — Telephone Encounter (Signed)
 Can we start PA please

## 2023-08-05 NOTE — Telephone Encounter (Signed)
 Requested Prescriptions  Pending Prescriptions Disp Refills   BD PEN NEEDLE MICRO ULTRAFINE 32G X 6 MM MISC [Pharmacy Med Name: BD UF MICRO PEN NEEDLE 6MMX32G] 100 each 1    Sig: 1 EACH BY DOES NOT APPLY ROUTE DAILY AT 6 (SIX) AM.     Endocrinology: Diabetes - Testing Supplies Passed - 08/05/2023  1:40 PM      Passed - Valid encounter within last 12 months    Recent Outpatient Visits           1 month ago Type 2 diabetes mellitus without complication, with long-term current use of insulin  [facility])   [facility] Quinton Buckler, FNP       Future Appointments             In 2 months Abram Hoguet, Monalisa Angles, FNP [facility], [facility]

## 2023-08-11 ENCOUNTER — Telehealth: Payer: Self-pay | Admitting: Nurse Practitioner

## 2023-08-11 NOTE — Telephone Encounter (Signed)
 lisinopril  (ZESTRIL ) 5 MG tablet   saxagliptin  HCl (ONGLYZA) 5 MG TABS tablet   glipiZIDE  (GLUCOTROL ) 10 MG tablet

## 2023-08-11 NOTE — Telephone Encounter (Signed)
 Has refills on all meds, not due

## 2023-08-12 ENCOUNTER — Telehealth: Payer: Self-pay | Admitting: Nurse Practitioner

## 2023-08-12 DIAGNOSIS — E1165 Type 2 diabetes mellitus with hyperglycemia: Secondary | ICD-10-CM

## 2023-08-12 DIAGNOSIS — E119 Type 2 diabetes mellitus without complications: Secondary | ICD-10-CM

## 2023-08-12 MED ORDER — GLIPIZIDE 10 MG PO TABS: 10.0000 mg | ORAL_TABLET | Freq: Two times a day (BID) | ORAL | 0 refills | Status: DC

## 2023-08-12 MED ORDER — LISINOPRIL 5 MG PO TABS: 5.0000 mg | ORAL_TABLET | Freq: Every day | ORAL | 0 refills | Status: DC

## 2023-08-12 MED ORDER — SAXAGLIPTIN HCL 5 MG PO TABS
5.0000 mg | ORAL_TABLET | Freq: Every day | ORAL | 0 refills | Status: DC
Start: 2023-08-12 — End: 2023-10-19

## 2023-08-12 NOTE — Telephone Encounter (Signed)
 lisinopril  (ZESTRIL ) 5 MG tablet   glipiZIDE  (GLUCOTROL ) 10 MG tablet   saxagliptin  HCl (ONGLYZA) 5 MG TABS tablet

## 2023-09-15 ENCOUNTER — Other Ambulatory Visit: Payer: Self-pay | Admitting: Nurse Practitioner

## 2023-09-15 DIAGNOSIS — E119 Type 2 diabetes mellitus without complications: Secondary | ICD-10-CM

## 2023-09-15 DIAGNOSIS — E1165 Type 2 diabetes mellitus with hyperglycemia: Secondary | ICD-10-CM

## 2023-09-16 NOTE — Telephone Encounter (Signed)
 Requested medication (s) are due for refill today: na   Requested medication (s) are on the active medication list: yes   Last refill:  onglyza- 08/12/23 #90 0 refills , glipizide - 08/12/23 #180 0 refills, toujeo - 02/03/23 #6 ml 3 refills, 08/12/23 #90 0 refills  Future visit scheduled: yes 10/12/23  Notes to clinic:  pharmacy requesting 1 year supply do you want to refill for 1 year?     Requested Prescriptions  Pending Prescriptions Disp Refills   saxagliptin  HCl (ONGLYZA) 5 MG TABS tablet [Pharmacy Med Name: SAXAGLIPTIN   5MG   TAB] 90 tablet 3    Sig: TAKE 1 TABLET BY MOUTH DAILY     Endocrinology:  Diabetes - DPP-4 Inhibitors Passed - 09/16/2023  4:35 PM      Passed - HBA1C is between 0 and 7.9 and within 180 days    Hemoglobin A1C  Date Value Ref Range Status  06/09/2023 5.6 4.0 - 5.6 % Final   Hgb A1c MFr Bld  Date Value Ref Range Status  02/02/2023 9.6 (H) <5.7 % of total Hgb Final    Comment:    For someone without known diabetes, a hemoglobin A1c value of 6.5% or greater indicates that they may have  diabetes and this should be confirmed with a follow-up  test. . For someone with known diabetes, a value <7% indicates  that their diabetes is well controlled and a value  greater than or equal to 7% indicates suboptimal  control. A1c targets should be individualized based on  duration of diabetes, age, comorbid conditions, and  other considerations. . Currently, no consensus exists regarding use of hemoglobin A1c for diagnosis of diabetes for children. .          Passed - Cr in normal range and within 360 days    Creat  Date Value Ref Range Status  02/02/2023 0.91 0.60 - 1.29 mg/dL Final   Creatinine, Urine  Date Value Ref Range Status  02/02/2023 186 20 - 320 mg/dL Final         Passed - Valid encounter within last 6 months    Recent Outpatient Visits           3 months ago Type 2 diabetes mellitus without complication, with long-term current use of insulin   [facility])   [facility] Gareth Mliss FALCON, FNP       Future Appointments             In 3 weeks Gareth, Mliss FALCON, FNP [facility], [facility]             glipiZIDE  (GLUCOTROL ) 10 MG tablet [Pharmacy Med Name: glipiZIDE  10 MG Oral Tablet] 180 tablet 3    Sig: TAKE 1 TABLET BY MOUTH TWICE  DAILY BEFORE MEALS     Endocrinology:  Diabetes - Sulfonylureas Passed - 09/16/2023  4:35 PM      Passed - HBA1C is between 0 and 7.9 and within 180 days    Hemoglobin A1C  Date Value Ref Range Status  06/09/2023 5.6 4.0 - 5.6 % Final   Hgb A1c MFr Bld  Date Value Ref Range Status  02/02/2023 9.6 (H) <5.7 % of total Hgb Final    Comment:    For someone without known diabetes, a hemoglobin A1c value of 6.5% or greater indicates that they may have  diabetes and this should be confirmed with a follow-up  test. . For someone with known diabetes, a value <7% indicates  that their diabetes is well controlled and a value  greater than or equal to 7% indicates suboptimal  control. A1c targets should be individualized based on  duration of diabetes, age, comorbid conditions, and  other considerations. . Currently, no consensus exists regarding use of hemoglobin A1c for diagnosis of diabetes for children. .          Passed - Cr in normal range and within 360 days    Creat  Date Value Ref Range Status  02/02/2023 0.91 0.60 - 1.29 mg/dL Final   Creatinine, Urine  Date Value Ref Range Status  02/02/2023 186 20 - 320 mg/dL Final         Passed - Valid encounter within last 6 months    Recent Outpatient Visits           3 months ago Type 2 diabetes mellitus without complication, with long-term current use of insulin  [facility])   [facility] Gareth Mliss FALCON, FNP       Future Appointments             In 3 weeks Gareth, Mliss FALCON, FNP [facility], [facility]             TOUJEO  MAX SOLOSTAR 300  UNIT/ML Solostar Pen [employer] Med Name: Toujeo  Max SoloStar 300 UNIT/ML Subcutaneous Solution Pen-injector] 6 mL 3    Sig: INJECT SUBCUTANEOUSLY 10 UNITS  DAILY AT 6 (SIX) AM     Endocrinology:  Diabetes - Insulins Passed - 09/16/2023  4:35 PM      Passed - HBA1C is between 0 and 7.9 and within 180 days    Hemoglobin A1C  Date Value Ref Range Status  06/09/2023 5.6 4.0 - 5.6 % Final   Hgb A1c MFr Bld  Date Value Ref Range Status  02/02/2023 9.6 (H) <5.7 % of total Hgb Final    Comment:    For someone without known diabetes, a hemoglobin A1c value of 6.5% or greater indicates that they may have  diabetes and this should be confirmed with a follow-up  test. . For someone with known diabetes, a value <7% indicates  that their diabetes is well controlled and a value  greater than or equal to 7% indicates suboptimal  control. A1c targets should be individualized based on  duration of diabetes, age, comorbid conditions, and  other considerations. . Currently, no consensus exists regarding use of hemoglobin A1c for diagnosis of diabetes for children. SABRA Amy - Valid encounter within last 6 months    Recent Outpatient Visits           3 months ago Type 2 diabetes mellitus without complication, with long-term current use of insulin  [facility])   [facility] Gareth Mliss FALCON, FNP       Future Appointments             In 3 weeks Gareth Mliss FALCON, FNP [facility], [facility]             lisinopril  (ZESTRIL ) 5 MG tablet [Pharmacy Med Name: Lisinopril  5 MG Oral Tablet] 90 tablet 3    Sig: TAKE 1 TABLET BY MOUTH DAILY     Cardiovascular:  ACE Inhibitors Failed - 09/16/2023  4:35 PM      Failed - Cr in normal range and within 180 days    Creat  Date Value Ref Range Status  02/02/2023 0.91 0.60 -  1.29 mg/dL Final   Creatinine, Urine  Date Value Ref Range Status  02/02/2023 186 20 - 320 mg/dL Final         Failed - K in  normal range and within 180 days    Potassium  Date Value Ref Range Status  02/02/2023 5.4 (H) 3.5 - 5.3 mmol/L Final         Passed - Patient is not pregnant      Passed - Last BP in normal range    BP Readings from Last 1 Encounters:  06/09/23 120/72         Passed - Valid encounter within last 6 months    Recent Outpatient Visits           3 months ago Type 2 diabetes mellitus without complication, with long-term current use of insulin  [facility])   [facility] Gareth Mliss FALCON, FNP       Future Appointments             In 3 weeks Gareth, Mliss FALCON, FNP [facility], [facility]

## 2023-10-06 ENCOUNTER — Other Ambulatory Visit (HOSPITAL_COMMUNITY): Payer: Self-pay

## 2023-10-06 NOTE — Telephone Encounter (Signed)
 [employer] is Plan/Benefit Exclusion, below is a list of covered alternatives

## 2023-10-07 LAB — HM DIABETES EYE EXAM

## 2023-10-12 ENCOUNTER — Ambulatory Visit: Admitting: Nurse Practitioner

## 2023-10-14 ENCOUNTER — Ambulatory Visit: Admitting: Nurse Practitioner

## 2023-10-17 ENCOUNTER — Other Ambulatory Visit: Payer: Self-pay | Admitting: Nurse Practitioner

## 2023-10-17 DIAGNOSIS — E1165 Type 2 diabetes mellitus with hyperglycemia: Secondary | ICD-10-CM

## 2023-10-19 NOTE — Telephone Encounter (Signed)
 Requested Prescriptions  Pending Prescriptions Disp Refills   saxagliptin  HCl (ONGLYZA) 5 MG TABS tablet [Pharmacy Med Name: SAXAGLIPTIN   5MG   TAB] 90 tablet 0    Sig: TAKE 1 TABLET BY MOUTH DAILY     Endocrinology:  Diabetes - DPP-4 Inhibitors Passed - 10/19/2023  2:06 PM      Passed - HBA1C is between 0 and 7.9 and within 180 days    Hemoglobin A1C  Date Value Ref Range Status  06/09/2023 5.6 4.0 - 5.6 % Final   Hgb A1c MFr Bld  Date Value Ref Range Status  02/02/2023 9.6 (H) <5.7 % of total Hgb Final    Comment:    For someone without known diabetes, a hemoglobin A1c value of 6.5% or greater indicates that they may have  diabetes and this should be confirmed with a follow-up  test. . For someone with known diabetes, a value <7% indicates  that their diabetes is well controlled and a value  greater than or equal to 7% indicates suboptimal  control. A1c targets should be individualized based on  duration of diabetes, age, comorbid conditions, and  other considerations. . Currently, no consensus exists regarding use of hemoglobin A1c for diagnosis of diabetes for children. .          Passed - Cr in normal range and within 360 days    Creat  Date Value Ref Range Status  02/02/2023 0.91 0.60 - 1.29 mg/dL Final   Creatinine, Urine  Date Value Ref Range Status  02/02/2023 186 20 - 320 mg/dL Final         Passed - Valid encounter within last 6 months    Recent Outpatient Visits           4 months ago Type 2 diabetes mellitus without complication, with long-term current use of insulin  [facility])   [facility] Gareth Mliss FALCON, [place]

## 2023-11-03 ENCOUNTER — Other Ambulatory Visit: Payer: Self-pay | Admitting: Nurse Practitioner

## 2023-11-03 DIAGNOSIS — E119 Type 2 diabetes mellitus without complications: Secondary | ICD-10-CM

## 2023-11-03 DIAGNOSIS — E1165 Type 2 diabetes mellitus with hyperglycemia: Secondary | ICD-10-CM

## 2023-11-04 ENCOUNTER — Ambulatory Visit (INDEPENDENT_AMBULATORY_CARE_PROVIDER_SITE_OTHER): Admitting: Nurse Practitioner

## 2023-11-04 VITALS — BP 122/82 | HR 81 | Resp 18 | Ht 66.0 in | Wt 160.6 lb

## 2023-11-04 DIAGNOSIS — E782 Mixed hyperlipidemia: Secondary | ICD-10-CM

## 2023-11-04 DIAGNOSIS — Z794 Long term (current) use of insulin: Secondary | ICD-10-CM

## 2023-11-04 DIAGNOSIS — Z7984 Long term (current) use of oral hypoglycemic drugs: Secondary | ICD-10-CM

## 2023-11-04 DIAGNOSIS — Z23 Encounter for immunization: Secondary | ICD-10-CM | POA: Diagnosis not present

## 2023-11-04 DIAGNOSIS — E119 Type 2 diabetes mellitus without complications: Secondary | ICD-10-CM

## 2023-11-04 DIAGNOSIS — Z1211 Encounter for screening for malignant neoplasm of colon: Secondary | ICD-10-CM

## 2023-11-04 DIAGNOSIS — Z130 Encounter for screening for diseases of the blood and blood-forming organs and certain disorders involving the immune mechanism: Secondary | ICD-10-CM

## 2023-11-04 MED ORDER — GLIPIZIDE 10 MG PO TABS: 10.0000 mg | ORAL_TABLET | Freq: Two times a day (BID) | ORAL | 1 refills | Status: DC

## 2023-11-04 MED ORDER — TOUJEO MAX SOLOSTAR 300 UNIT/ML ~~LOC~~ SOPN: 16.0000 [IU] | PEN_INJECTOR | Freq: Every day | SUBCUTANEOUS | 5 refills | Status: DC

## 2023-11-04 MED ORDER — LISINOPRIL 5 MG PO TABS
5.0000 mg | ORAL_TABLET | Freq: Every day | ORAL | 1 refills | Status: DC
Start: 2023-11-04 — End: 2024-01-16

## 2023-11-04 NOTE — Progress Notes (Signed)
 BP 122/82   Pulse 81   Resp 18   Ht 5' 6 (1.676 m)   Wt 160 lb 9.6 oz (72.8 kg)   SpO2 97%   BMI 25.92 kg/m    Subjective:    Patient ID: Thomas Stephenson, male    DOB: [birth year], 43 y.o.   MRN: [id number]  HPI: Shivan Hodes is a 43 y.o. male  Chief Complaint  Patient presents with   Medical Management of Chronic Issues   Diabetes   Hypertension    Discussed the use of AI scribe software for clinical note transcription with the patient, who gave verbal consent to proceed.  History of Present Illness Fraser Busche is a 43 year old male with type 2 diabetes and mixed hyperlipidemia who presents for a four month follow-up.  Glycemic control in type 2 diabetes mellitus - Blood glucose levels stable on current regimen: glipizide  10 mg twice daily, Toujeo  16 units daily, saxagliptin  5 mg daily - Last hemoglobin A1c 5.6, improved from previous 9.6 - Attempts to avoid carbohydrates, with occasional consumption - No symptoms of hypoglycemia or hyperglycemia reported  Insulin  storage and administration concerns - Does not refrigerate insulin  pens and lacks air conditioning at home - Recent insulin  delivery arrived in a large box with ice packs that melted before retrieval - Concerned about the effectiveness of insulin  due to possible improper storage  Medication management - Due for prescription renewals for glipizide , lisinopril , and Toujeo  - Manages prescriptions through [facility] pharmacy service  Colorectal cancer screening - Now age 1 and due for colorectal cancer screening - Discussed options for screening, including colonoscopy and non-invasive Cologuard test  Tobacco use - History of smoking two and a half to three packs of [place] 100s daily for twenty years          11/04/2023    1:55 PM 06/09/2023    7:40 AM 02/02/2023   10:27 AM  Depression screen PHQ 2/9  Decreased Interest 0 0 0  Down, Depressed, Hopeless 0 0 0  PHQ - 2 Score 0 0 0  Altered sleeping 0  0   Tired, decreased energy 0  0  Change in appetite 0  0  Feeling bad or failure about yourself  0  0  Trouble concentrating 0  0  Moving slowly or fidgety/restless 0  0  Suicidal thoughts 0  0  PHQ-9 Score 0  0  Difficult doing work/chores Not difficult at all  Not difficult at all    Relevant past medical, surgical, family and social history reviewed and updated as indicated. Interim medical history since our last visit reviewed. Allergies and medications reviewed and updated.  Review of Systems  Constitutional: Negative for fever or weight change.  Respiratory: Negative for cough and shortness of breath.   Cardiovascular: Negative for chest pain or palpitations.  Gastrointestinal: Negative for abdominal pain, no bowel changes.  Musculoskeletal: Negative for gait problem or joint swelling.  Skin: Negative for rash.  Neurological: Negative for dizziness or headache.  No other specific complaints in a complete review of systems (except as listed in HPI above).      Objective:     BP 122/82   Pulse 81   Resp 18   Ht 5' 6 (1.676 m)   Wt 160 lb 9.6 oz (72.8 kg)   SpO2 97%   BMI 25.92 kg/m    Wt Readings from Last 3 Encounters:  11/04/23 160 lb 9.6 oz (72.8 kg)  06/09/23 152 lb  14.4 oz (69.4 kg)  02/02/23 144 lb 9.6 oz (65.6 kg)    Physical Exam Physical Exam GENERAL: Alert, cooperative, well developed, no acute distress. HEENT: Normocephalic, normal oropharynx, moist mucous membranes. CHEST: Clear to auscultation bilaterally, no wheezes, rhonchi, or crackles. CARDIOVASCULAR: Normal heart rate and rhythm, S1 and S2 normal without murmurs. ABDOMEN: Soft, non-tender, non-distended, without organomegaly, normal bowel sounds. EXTREMITIES: No cyanosis or edema. NEUROLOGICAL: Cranial nerves grossly intact, moves all extremities without gross motor or sensory deficit.   Results for orders placed or performed in visit on 10/08/23  HM DIABETES EYE EXAM   Collection Time:  10/07/23 11:12 AM  Result Value Ref Range   HM Diabetic Eye Exam Retinopathy (A) No Retinopathy          Assessment & Plan:   Problem List Items Addressed This Visit       Endocrine   Type 2 diabetes mellitus without complication, without long-term current use of insulin  (HCC) - Primary   Relevant Medications   lisinopril  (ZESTRIL ) 5 MG tablet   glipiZIDE  (GLUCOTROL ) 10 MG tablet   insulin  glargine, 2 Unit Dial, (TOUJEO  MAX SOLOSTAR) 300 UNIT/ML Solostar Pen   Other Relevant Orders   Comprehensive metabolic panel with GFR   Hemoglobin A1c     Other   Mixed hyperlipidemia   Relevant Medications   lisinopril  (ZESTRIL ) 5 MG tablet   Other Relevant Orders   Comprehensive metabolic panel with GFR   Lipid panel   Other Visit Diagnoses       Screening for deficiency anemia       Relevant Orders   CBC with Differential/Platelet     Screening for colon cancer       Relevant Orders   Cologuard     Immunization due       Relevant Orders   Pneumococcal conjugate vaccine 20-valent (Prevnar 20) (Completed)        Assessment and Plan Assessment & Plan Type 2 diabetes mellitus Type 2 diabetes mellitus is well-controlled with an A1c of 5.6, improved from 9.6. He is on glipizide , Toujeo , and saxagliptin . There is concern about insulin  potency due to storage conditions without air conditioning. - Send prescription renewals for glipizide , lisinopril , and Toujeo  to [facility].  Mixed hyperlipidemia Mixed hyperlipidemia is being managed with the last recorded LDL at 120.  Essential hypertension Essential hypertension is being managed with lisinopril . - Send prescription renewals for lisinopril  to [facility].        Follow up plan: Return in about 4 months (around 03/05/2024) for follow up.

## 2023-11-05 ENCOUNTER — Ambulatory Visit: Payer: Self-pay | Admitting: Nurse Practitioner

## 2023-11-05 LAB — COMPREHENSIVE METABOLIC PANEL WITH GFR
AG Ratio: 2.1 (calc) (ref 1.0–2.5)
ALT: 22 U/L (ref 9–46)
AST: 19 U/L (ref 10–40)
Albumin: 4.6 g/dL (ref 3.6–5.1)
Alkaline phosphatase (APISO): 54 U/L (ref 36–130)
BUN: 25 mg/dL (ref 7–25)
CO2: 25 mmol/L (ref 20–32)
Calcium: 9.6 mg/dL (ref 8.6–10.3)
Chloride: 106 mmol/L (ref 98–110)
Creat: 0.8 mg/dL (ref 0.60–1.29)
Globulin: 2.2 g/dL (ref 1.9–3.7)
Glucose, Bld: 122 mg/dL — ABNORMAL HIGH (ref 65–99)
Potassium: 4.3 mmol/L (ref 3.5–5.3)
Sodium: 139 mmol/L (ref 135–146)
Total Bilirubin: 0.5 mg/dL (ref 0.2–1.2)
Total Protein: 6.8 g/dL (ref 6.1–8.1)
eGFR: 111 mL/min/1.73m2 (ref >=60)

## 2023-11-05 LAB — CBC WITH DIFFERENTIAL/PLATELET
Absolute Lymphocytes: 2256 {cells}/uL (ref 850–3900)
Absolute Monocytes: 527 {cells}/uL (ref 200–950)
Basophils Absolute: 59 {cells}/uL (ref 0–200)
Basophils Relative: 0.9 %
Eosinophils Absolute: 208 {cells}/uL (ref 15–500)
Eosinophils Relative: 3.2 %
HCT: 42.2 % (ref 38.5–50.0)
Hemoglobin: 14.4 g/dL (ref 13.2–17.1)
MCH: 29.8 pg (ref 27.0–33.0)
MCHC: 34.1 g/dL (ref 32.0–36.0)
MCV: 87.2 fL (ref 80.0–100.0)
MPV: 10 fL (ref 7.5–12.5)
Monocytes Relative: 8.1 %
Neutro Abs: 3452 {cells}/uL (ref 1500–7800)
Neutrophils Relative %: 53.1 %
Platelets: 215 Thousand/uL (ref 140–400)
RBC: 4.84 Million/uL (ref 4.20–5.80)
RDW: 12.4 % (ref 11.0–15.0)
Total Lymphocyte: 34.7 %
WBC: 6.5 Thousand/uL (ref 3.8–10.8)

## 2023-11-05 LAB — HEMOGLOBIN A1C
Hgb A1c MFr Bld: 6.2 % — ABNORMAL HIGH (ref <5.7)
Mean Plasma Glucose: 131 mg/dL
eAG (mmol/L): 7.3 mmol/L

## 2023-11-05 LAB — LIPID PANEL
Cholesterol: 170 mg/dL (ref <200)
HDL: 36 mg/dL — ABNORMAL LOW (ref >=40)
LDL Cholesterol (Calc): 111 mg/dL — ABNORMAL HIGH
Non-HDL Cholesterol (Calc): 134 mg/dL — ABNORMAL HIGH (ref <130)
Total CHOL/HDL Ratio: 4.7 (calc) (ref <5.0)
Triglycerides: 115 mg/dL (ref <150)

## 2023-11-05 NOTE — Telephone Encounter (Signed)
 Duplicate request.  Requested Prescriptions  Pending Prescriptions Disp Refills   lisinopril  (ZESTRIL ) 5 MG tablet [Pharmacy Med Name: Lisinopril  5 MG Oral Tablet] 90 tablet 3    Sig: TAKE 1 TABLET BY MOUTH DAILY     Cardiovascular:  ACE Inhibitors Passed - 11/05/2023 10:46 AM      Passed - Cr in normal range and within 180 days    Creat  Date Value Ref Range Status  11/04/2023 0.80 0.60 - 1.29 mg/dL Final   Creatinine, Urine  Date Value Ref Range Status  02/02/2023 186 20 - 320 mg/dL Final         Passed - K in normal range and within 180 days    Potassium  Date Value Ref Range Status  11/04/2023 4.3 3.5 - 5.3 mmol/L Final         Passed - Patient is not pregnant      Passed - Last BP in normal range    BP Readings from Last 1 Encounters:  11/04/23 122/82         Passed - Valid encounter within last 6 months    Recent Outpatient Visits           Yesterday Type 2 diabetes mellitus without complication, without long-term current use of insulin  [facility])   [facility] Gareth Mliss FALCON, FNP   4 months ago Type 2 diabetes mellitus without complication, with long-term current use of insulin  [facility])   [place] [facility] Gareth Mliss FALCON, FNP       Future Appointments             In 4 months Gareth, Mliss FALCON, FNP [facility]             glipiZIDE  (GLUCOTROL ) 10 MG tablet [Pharmacy Med Name: glipiZIDE  10 MG Oral Tablet] 180 tablet 3    Sig: TAKE 1 TABLET BY MOUTH TWICE  DAILY BEFORE MEALS     Endocrinology:  Diabetes - Sulfonylureas Passed - 11/05/2023 10:46 AM      Passed - HBA1C is between 0 and 7.9 and within 180 days    Hgb A1c MFr Bld  Date Value Ref Range Status  11/04/2023 6.2 (H) <5.7 % Final    Comment:    For someone without known diabetes, a hemoglobin  A1c value between 5.7% and 6.4% is consistent with prediabetes and should be confirmed with a  follow-up test. . For someone  with known diabetes, a value <7% indicates that their diabetes is well controlled. A1c targets should be individualized based on duration of diabetes, age, comorbid conditions, and other considerations. . This assay result is consistent with an increased risk of diabetes. . Currently, no consensus exists regarding use of hemoglobin A1c for diagnosis of diabetes for children. .          Passed - Cr in normal range and within 360 days    Creat  Date Value Ref Range Status  11/04/2023 0.80 0.60 - 1.29 mg/dL Final   Creatinine, Urine  Date Value Ref Range Status  02/02/2023 186 20 - 320 mg/dL Final         Passed - Valid encounter within last 6 months    Recent Outpatient Visits           Yesterday Type 2 diabetes mellitus without complication, without long-term current use of insulin  [facility])   [facility] Gareth Mliss FALCON, FNP   4 months  ago Type 2 diabetes mellitus without complication, with long-term current use of insulin  [facility])   [facility] Gareth Mliss FALCON, FNP       Future Appointments             In 4 months Gareth, Mliss FALCON, FNP [facility]             TOUJEO  MAX SOLOSTAR 300 UNIT/ML Solostar Pen [employer] Med Name: Toujeo  Max SoloStar 300 UNIT/ML Subcutaneous Solution Pen-injector] 6 mL 3    Sig: INJECT SUBCUTANEOUSLY 10 UNITS  DAILY AT 6 (SIX) AM     Endocrinology:  Diabetes - Insulins Passed - 11/05/2023 10:46 AM      Passed - HBA1C is between 0 and 7.9 and within 180 days    Hgb A1c MFr Bld  Date Value Ref Range Status  11/04/2023 6.2 (H) <5.7 % Final    Comment:    For someone without known diabetes, a hemoglobin  A1c value between 5.7% and 6.4% is consistent with prediabetes and should be confirmed with a  follow-up test. . For someone with known diabetes, a value <7% indicates that their diabetes is well controlled. A1c targets should be individualized based on  duration of diabetes, age, comorbid conditions, and other considerations. . This assay result is consistent with an increased risk of diabetes. . Currently, no consensus exists regarding use of hemoglobin A1c for diagnosis of diabetes for children. SABRA Amy - Valid encounter within last 6 months    Recent Outpatient Visits           Yesterday Type 2 diabetes mellitus without complication, without long-term current use of insulin  [facility])   [facility] Gareth Mliss F, FNP   4 months ago Type 2 diabetes mellitus without complication, with long-term current use of insulin  [facility])   [facility] Gareth Mliss FALCON, FNP       Future Appointments             In 4 months Gareth, Mliss FALCON, FNP [facility]

## 2023-12-03 LAB — COLOGUARD: COLOGUARD: NEGATIVE

## 2024-01-11 ENCOUNTER — Inpatient Hospital Stay (HOSPITAL_COMMUNITY)

## 2024-01-11 ENCOUNTER — Emergency Department (HOSPITAL_COMMUNITY)

## 2024-01-11 DIAGNOSIS — I4581 Long QT syndrome: Secondary | ICD-10-CM | POA: Diagnosis not present

## 2024-01-11 DIAGNOSIS — Z794 Long term (current) use of insulin: Secondary | ICD-10-CM | POA: Diagnosis not present

## 2024-01-11 DIAGNOSIS — E870 Hyperosmolality and hypernatremia: Secondary | ICD-10-CM | POA: Diagnosis present

## 2024-01-11 DIAGNOSIS — Z79899 Other long term (current) drug therapy: Secondary | ICD-10-CM | POA: Diagnosis not present

## 2024-01-11 DIAGNOSIS — I469 Cardiac arrest, cause unspecified: Secondary | ICD-10-CM | POA: Diagnosis present

## 2024-01-11 DIAGNOSIS — K7200 Acute and subacute hepatic failure without coma: Secondary | ICD-10-CM | POA: Diagnosis present

## 2024-01-11 DIAGNOSIS — E1165 Type 2 diabetes mellitus with hyperglycemia: Secondary | ICD-10-CM | POA: Diagnosis present

## 2024-01-11 DIAGNOSIS — Z515 Encounter for palliative care: Secondary | ICD-10-CM | POA: Diagnosis not present

## 2024-01-11 DIAGNOSIS — E8721 Acute metabolic acidosis: Secondary | ICD-10-CM

## 2024-01-11 DIAGNOSIS — Z7984 Long term (current) use of oral hypoglycemic drugs: Secondary | ICD-10-CM | POA: Diagnosis not present

## 2024-01-11 DIAGNOSIS — I10 Essential (primary) hypertension: Secondary | ICD-10-CM | POA: Diagnosis present

## 2024-01-11 DIAGNOSIS — G40409 Other generalized epilepsy and epileptic syndromes, not intractable, without status epilepticus: Secondary | ICD-10-CM | POA: Diagnosis not present

## 2024-01-11 DIAGNOSIS — N179 Acute kidney failure, unspecified: Secondary | ICD-10-CM | POA: Diagnosis present

## 2024-01-11 DIAGNOSIS — G40401 Other generalized epilepsy and epileptic syndromes, not intractable, with status epilepticus: Secondary | ICD-10-CM | POA: Diagnosis not present

## 2024-01-11 DIAGNOSIS — D649 Anemia, unspecified: Secondary | ICD-10-CM | POA: Diagnosis present

## 2024-01-11 DIAGNOSIS — R569 Unspecified convulsions: Secondary | ICD-10-CM | POA: Diagnosis not present

## 2024-01-11 DIAGNOSIS — I4510 Unspecified right bundle-branch block: Secondary | ICD-10-CM | POA: Diagnosis present

## 2024-01-11 DIAGNOSIS — Z811 Family history of alcohol abuse and dependence: Secondary | ICD-10-CM

## 2024-01-11 DIAGNOSIS — Z8349 Family history of other endocrine, nutritional and metabolic diseases: Secondary | ICD-10-CM

## 2024-01-11 DIAGNOSIS — G9389 Other specified disorders of brain: Secondary | ICD-10-CM | POA: Diagnosis present

## 2024-01-11 DIAGNOSIS — I214 Non-ST elevation (NSTEMI) myocardial infarction: Secondary | ICD-10-CM | POA: Diagnosis present

## 2024-01-11 DIAGNOSIS — G253 Myoclonus: Secondary | ICD-10-CM | POA: Diagnosis present

## 2024-01-11 DIAGNOSIS — E872 Acidosis, unspecified: Secondary | ICD-10-CM | POA: Diagnosis present

## 2024-01-11 DIAGNOSIS — L8990 Pressure ulcer of unspecified site, unspecified stage: Secondary | ICD-10-CM | POA: Diagnosis present

## 2024-01-11 DIAGNOSIS — I462 Cardiac arrest due to underlying cardiac condition: Secondary | ICD-10-CM | POA: Diagnosis present

## 2024-01-11 DIAGNOSIS — Z9102 Food additives allergy status: Secondary | ICD-10-CM

## 2024-01-11 DIAGNOSIS — R7401 Elevation of levels of liver transaminase levels: Secondary | ICD-10-CM

## 2024-01-11 DIAGNOSIS — Z66 Do not resuscitate: Secondary | ICD-10-CM | POA: Diagnosis not present

## 2024-01-11 DIAGNOSIS — E785 Hyperlipidemia, unspecified: Secondary | ICD-10-CM | POA: Diagnosis present

## 2024-01-11 DIAGNOSIS — J9601 Acute respiratory failure with hypoxia: Secondary | ICD-10-CM | POA: Diagnosis present

## 2024-01-11 DIAGNOSIS — Z813 Family history of other psychoactive substance abuse and dependence: Secondary | ICD-10-CM

## 2024-01-11 DIAGNOSIS — G931 Anoxic brain damage, not elsewhere classified: Secondary | ICD-10-CM | POA: Diagnosis present

## 2024-01-11 DIAGNOSIS — Z9911 Dependence on respirator [ventilator] status: Secondary | ICD-10-CM

## 2024-01-11 DIAGNOSIS — Z818 Family history of other mental and behavioral disorders: Secondary | ICD-10-CM

## 2024-01-11 LAB — URINALYSIS, ROUTINE W REFLEX MICROSCOPIC
Bacteria, UA: NONE SEEN
Bilirubin Urine: NEGATIVE
Glucose, UA: >=500 mg/dL — AB
Ketones, ur: 5 mg/dL — AB
Leukocytes,Ua: NEGATIVE
Nitrite: NEGATIVE
Protein, ur: 100 mg/dL — AB
Specific Gravity, Urine: 1.01 (ref 1.005–1.030)
pH: 6 (ref 5.0–8.0)

## 2024-01-11 LAB — LIPID PANEL
Cholesterol: 160 mg/dL (ref 0–200)
HDL: 28 mg/dL — ABNORMAL LOW (ref >40)
LDL Cholesterol: 116 mg/dL — ABNORMAL HIGH (ref 0–99)
Total CHOL/HDL Ratio: 5.7 ratio
Triglycerides: 82 mg/dL (ref <150)
VLDL: 16 mg/dL (ref 0–40)

## 2024-01-11 LAB — I-STAT CHEM 8, ED
BUN: 19 mg/dL (ref 6–20)
Calcium, Ion: 1.09 mmol/L — ABNORMAL LOW (ref 1.15–1.40)
Chloride: 104 mmol/L (ref 98–111)
Creatinine, Ser: 1.2 mg/dL (ref 0.61–1.24)
Glucose, Bld: 348 mg/dL — ABNORMAL HIGH (ref 70–99)
HCT: 46 % (ref 39.0–52.0)
Hemoglobin: 15.6 g/dL (ref 13.0–17.0)
Potassium: 3.9 mmol/L (ref 3.5–5.1)
Sodium: 137 mmol/L (ref 135–145)
TCO2: 16 mmol/L — ABNORMAL LOW (ref 22–32)

## 2024-01-11 LAB — CBC WITH DIFFERENTIAL/PLATELET
Abs Immature Granulocytes: 0.3 K/uL — ABNORMAL HIGH (ref 0.00–0.07)
Basophils Absolute: 0.1 K/uL (ref 0.0–0.1)
Basophils Relative: 1 %
Eosinophils Absolute: 0.3 K/uL (ref 0.0–0.5)
Eosinophils Relative: 3 %
HCT: 45.3 % (ref 39.0–52.0)
Hemoglobin: 15.3 g/dL (ref 13.0–17.0)
Immature Granulocytes: 3 %
Lymphocytes Relative: 21 %
Lymphs Abs: 2.2 K/uL (ref 0.7–4.0)
MCH: 29.6 pg (ref 26.0–34.0)
MCHC: 33.8 g/dL (ref 30.0–36.0)
MCV: 87.6 fL (ref 80.0–100.0)
Monocytes Absolute: 0.4 K/uL (ref 0.1–1.0)
Monocytes Relative: 4 %
Neutro Abs: 7.1 K/uL (ref 1.7–7.7)
Neutrophils Relative %: 68 %
Platelets: 239 K/uL (ref 150–400)
RBC: 5.17 MIL/uL (ref 4.22–5.81)
RDW: 11.9 % (ref 11.5–15.5)
WBC: 10.5 K/uL (ref 4.0–10.5)
nRBC: 0 % (ref 0.0–0.2)

## 2024-01-11 LAB — TROPONIN I (HIGH SENSITIVITY): Troponin I (High Sensitivity): 105 ng/L (ref <18)

## 2024-01-11 LAB — I-STAT CG4 LACTIC ACID, ED: Lactic Acid, Venous: 6.2 mmol/L (ref 0.5–1.9)

## 2024-01-11 LAB — I-STAT ARTERIAL BLOOD GAS, ED
Acid-base deficit: 10 mmol/L — ABNORMAL HIGH (ref 0.0–2.0)
Bicarbonate: 16.6 mmol/L — ABNORMAL LOW (ref 20.0–28.0)
Calcium, Ion: 1.16 mmol/L (ref 1.15–1.40)
HCT: 46 % (ref 39.0–52.0)
Hemoglobin: 15.6 g/dL (ref 13.0–17.0)
O2 Saturation: 100 %
Patient temperature: 95.2
Potassium: 3.2 mmol/L — ABNORMAL LOW (ref 3.5–5.1)
Sodium: 138 mmol/L (ref 135–145)
TCO2: 18 mmol/L — ABNORMAL LOW (ref 22–32)
pCO2 arterial: 36.1 mmHg (ref 32–48)
pH, Arterial: 7.261 — ABNORMAL LOW (ref 7.35–7.45)
pO2, Arterial: 599 mmHg — ABNORMAL HIGH (ref 83–108)

## 2024-01-11 LAB — PROTIME-INR
INR: 1.1 (ref 0.8–1.2)
Prothrombin Time: 14.5 s (ref 11.4–15.2)

## 2024-01-11 LAB — RAPID URINE DRUG SCREEN, HOSP PERFORMED
Amphetamines: NOT DETECTED
Barbiturates: NOT DETECTED
Benzodiazepines: POSITIVE — AB
Cocaine: NOT DETECTED
Opiates: NOT DETECTED
Tetrahydrocannabinol: NOT DETECTED

## 2024-01-11 LAB — I-STAT VENOUS BLOOD GAS, ED
Acid-base deficit: 10 mmol/L — ABNORMAL HIGH (ref 0.0–2.0)
Bicarbonate: 15.1 mmol/L — ABNORMAL LOW (ref 20.0–28.0)
Calcium, Ion: 1.09 mmol/L — ABNORMAL LOW (ref 1.15–1.40)
HCT: 45 % (ref 39.0–52.0)
Hemoglobin: 15.3 g/dL (ref 13.0–17.0)
O2 Saturation: 99 %
Potassium: 3.9 mmol/L (ref 3.5–5.1)
Sodium: 137 mmol/L (ref 135–145)
TCO2: 16 mmol/L — ABNORMAL LOW (ref 22–32)
pCO2, Ven: 30 mmHg — ABNORMAL LOW (ref 44–60)
pH, Ven: 7.311 (ref 7.25–7.43)
pO2, Ven: 154 mmHg — ABNORMAL HIGH (ref 32–45)

## 2024-01-11 LAB — COMPREHENSIVE METABOLIC PANEL WITH GFR
ALT: 332 U/L — ABNORMAL HIGH (ref 0–44)
AST: 237 U/L — ABNORMAL HIGH (ref 15–41)
Albumin: 3.7 g/dL (ref 3.5–5.0)
Alkaline Phosphatase: 53 U/L (ref 38–126)
Anion gap: 19 — ABNORMAL HIGH (ref 5–15)
BUN: 16 mg/dL (ref 6–20)
CO2: 15 mmol/L — ABNORMAL LOW (ref 22–32)
Calcium: 8.5 mg/dL — ABNORMAL LOW (ref 8.9–10.3)
Chloride: 101 mmol/L (ref 98–111)
Creatinine, Ser: 1.45 mg/dL — ABNORMAL HIGH (ref 0.61–1.24)
GFR, Estimated: >60 mL/min (ref >60)
Glucose, Bld: 340 mg/dL — ABNORMAL HIGH (ref 70–99)
Potassium: 3.8 mmol/L (ref 3.5–5.1)
Sodium: 135 mmol/L (ref 135–145)
Total Bilirubin: 0.9 mg/dL (ref 0.0–1.2)
Total Protein: 6 g/dL — ABNORMAL LOW (ref 6.5–8.1)

## 2024-01-11 LAB — ETHANOL: Alcohol, Ethyl (B): <15 mg/dL (ref <15)

## 2024-01-11 LAB — MAGNESIUM: Magnesium: 2.2 mg/dL (ref 1.7–2.4)

## 2024-01-11 LAB — APTT: aPTT: 28 s (ref 24–36)

## 2024-01-11 LAB — BRAIN NATRIURETIC PEPTIDE: B Natriuretic Peptide: 20.9 pg/mL (ref 0.0–100.0)

## 2024-01-11 LAB — CBG MONITORING, ED: Glucose-Capillary: 298 mg/dL — ABNORMAL HIGH (ref 70–99)

## 2024-01-11 MED ORDER — FENTANYL 2500MCG IN NS 250ML (10MCG/ML) PREMIX INFUSION
0.0000 ug/h | INTRAVENOUS | Status: DC
  Administered 2024-01-11: 25 ug/h via INTRAVENOUS
  Filled 2024-01-11 (×2): qty 250

## 2024-01-11 MED ORDER — HEPARIN SODIUM (PORCINE) 5000 UNIT/ML IJ SOLN
5000.0000 [IU] | Freq: Three times a day (TID) | INTRAMUSCULAR | Status: DC
  Administered 2024-01-11: 5000 [IU] via SUBCUTANEOUS
  Filled 2024-01-11: qty 1

## 2024-01-11 MED ORDER — SODIUM CHLORIDE 0.9 % IV SOLN: INTRAVENOUS | Status: AC

## 2024-01-11 MED ORDER — SODIUM BICARBONATE 8.4 % IV SOLN
INTRAVENOUS | Status: AC
  Filled 2024-01-11: qty 50

## 2024-01-11 MED ORDER — FENTANYL BOLUS VIA INFUSION: 25.0000 ug | INTRAVENOUS | Status: DC | PRN

## 2024-01-11 MED ORDER — CHLORHEXIDINE GLUCONATE CLOTH 2 % EX PADS
6.0000 | MEDICATED_PAD | Freq: Every day | CUTANEOUS | Status: DC
  Administered 2024-01-12 – 2024-01-15 (×5): 6 via TOPICAL

## 2024-01-11 MED ORDER — LACTATED RINGERS IV SOLN: INTRAVENOUS | Status: AC

## 2024-01-11 MED ORDER — FAMOTIDINE 20 MG PO TABS
20.0000 mg | ORAL_TABLET | Freq: Two times a day (BID) | ORAL | Status: DC
  Administered 2024-01-12 – 2024-01-13 (×3): 20 mg
  Filled 2024-01-11 (×3): qty 1

## 2024-01-11 MED ORDER — FAMOTIDINE IN NACL 20-0.9 MG/50ML-% IV SOLN
20.0000 mg | Freq: Two times a day (BID) | INTRAVENOUS | Status: DC
  Administered 2024-01-11: 20 mg via INTRAVENOUS
  Filled 2024-01-11 (×2): qty 50

## 2024-01-11 MED ORDER — PROPOFOL 1000 MG/100ML IV EMUL
0.0000 ug/kg/min | INTRAVENOUS | Status: DC
  Administered 2024-01-11: 5 ug/kg/min via INTRAVENOUS
  Administered 2024-01-12: 70 ug/kg/min via INTRAVENOUS
  Administered 2024-01-12: 50 ug/kg/min via INTRAVENOUS
  Filled 2024-01-11 (×4): qty 100

## 2024-01-11 MED ORDER — INSULIN ASPART 100 UNIT/ML IJ SOLN
0.0000 [IU] | INTRAMUSCULAR | Status: DC
  Administered 2024-01-12: 2 [IU] via SUBCUTANEOUS
  Administered 2024-01-12: 8 [IU] via SUBCUTANEOUS
  Administered 2024-01-12 (×4): 3 [IU] via SUBCUTANEOUS
  Administered 2024-01-12: 15 [IU] via SUBCUTANEOUS
  Administered 2024-01-13: 2 [IU] via SUBCUTANEOUS
  Administered 2024-01-13: 3 [IU] via SUBCUTANEOUS
  Administered 2024-01-13: 8 [IU] via SUBCUTANEOUS
  Administered 2024-01-13: 5 [IU] via SUBCUTANEOUS
  Administered 2024-01-13: 3 [IU] via SUBCUTANEOUS
  Administered 2024-01-13: 2 [IU] via SUBCUTANEOUS
  Administered 2024-01-14 (×2): 8 [IU] via SUBCUTANEOUS
  Administered 2024-01-14: 5 [IU] via SUBCUTANEOUS
  Administered 2024-01-14: 3 [IU] via SUBCUTANEOUS
  Administered 2024-01-14: 5 [IU] via SUBCUTANEOUS
  Administered 2024-01-14 – 2024-01-15 (×3): 8 [IU] via SUBCUTANEOUS
  Administered 2024-01-15 (×2): 5 [IU] via SUBCUTANEOUS
  Administered 2024-01-15: 8 [IU] via SUBCUTANEOUS

## 2024-01-11 MED ORDER — SODIUM BICARBONATE 8.4 % IV SOLN
100.0000 meq | Freq: Once | INTRAVENOUS | Status: AC
  Administered 2024-01-11: 100 meq via INTRAVENOUS
  Filled 2024-01-11: qty 50

## 2024-01-11 MED ORDER — ORAL CARE MOUTH RINSE
15.0000 mL | OROMUCOSAL | Status: DC
  Administered 2024-01-12 – 2024-01-15 (×45): 15 mL via OROMUCOSAL

## 2024-01-11 MED ORDER — ROCURONIUM BROMIDE 10 MG/ML (PF) SYRINGE
PREFILLED_SYRINGE | INTRAVENOUS | Status: AC | PRN
  Administered 2024-01-11: 100 mg via INTRAVENOUS

## 2024-01-11 MED ORDER — ORAL CARE MOUTH RINSE: 15.0000 mL | OROMUCOSAL | Status: DC | PRN

## 2024-01-11 MED ORDER — DOCUSATE SODIUM 50 MG/5ML PO LIQD: 100.0000 mg | Freq: Two times a day (BID) | ORAL | Status: DC | PRN

## 2024-01-11 MED ORDER — EPINEPHRINE HCL 5 MG/250ML IV SOLN IN NS
0.5000 ug/min | INTRAVENOUS | Status: DC
  Administered 2024-01-11: 4 ug/min via INTRAVENOUS

## 2024-01-11 MED ORDER — POLYETHYLENE GLYCOL 3350 17 G PO PACK: 17.0000 g | PACK | Freq: Every day | ORAL | Status: DC | PRN

## 2024-01-11 MED ORDER — ETOMIDATE 2 MG/ML IV SOLN
INTRAVENOUS | Status: AC | PRN
  Administered 2024-01-11: 20 mg via INTRAVENOUS

## 2024-01-11 NOTE — Consult Note (Signed)
 Cardiology Consultation   Patient ID: Yazir Koerber MRN: [id number]; DOB: 05-02-1980  Admit date: 01/11/2024 Date of Consult: 01/11/2024  PCP:  Gareth Mliss FALCON, FNP   [facility] Providers Cardiologist:  None        Patient Profile: Jami Ohlin is a 43 y.o. male with a hx of hypertension, hyperlipidemia, type 2 diabetes who is being seen 01/11/2024 for the evaluation of cardiac arrest at the request of the emergency department.  History of Present Illness: Mr. Sassano able to obtain history from the patient, as he was intubated and sedated.  Per ER, patient was at work and collapsed (was recorded on video).  Unknown downtime.  MS arrived, he was found in asystole.  CPR was started for a total of 18 minutes with 1 dose of epi prior to achieving ROSC.  Per ER, he has no history of drug use.  Per chart review, patient does not have a history of cardiovascular disease.  He has never seen a cardiologist in the past.  In the ER, ECG showed sinus rhythm with RBBB  ST elevation in aVR with ST depressions throughout the precordial and limb leads.  Qtc . Discussed case with interventional cardiology attending.  Notable labs include serum creatinine 1.45, point-of-care glucose 340, ST ALT 237/332, anion gap 19, white blood cell count 10.5.  H7.3, pCO2 30.  Currently intubated, on epinephrine and propofol.  Past Medical History:  Diagnosis Date   Diabetes mellitus without complication (HCC)    Hypertension     No past surgical history on file.   Home Medications:  Prior to Admission medications   Medication Sig Start Date End Date Taking? Authorizing Provider  BD PEN NEEDLE MICRO ULTRAFINE 32G X 6 MM MISC 1 EACH BY DOES NOT APPLY ROUTE DAILY AT 6 (SIX) AM. 08/05/23   Gareth Mliss FALCON, FNP  Continuous Glucose Sensor (DEXCOM G7 SENSOR) MISC Every 10 days 06/30/23   Pender, Julie F, FNP  glipiZIDE  (GLUCOTROL ) 10 MG tablet Take 1 tablet (10 mg total) by mouth 2 (two) times daily  before a meal. 11/04/23   Gareth Mliss FALCON, FNP  Glucagon , rDNA, (GLUCAGON  EMERGENCY) 1 MG KIT Inject 1 mg into the muscle as needed (hypoglycemia). 10/06/23   Gareth Mliss FALCON, FNP  insulin  glargine, 2 Unit Dial, (TOUJEO  MAX SOLOSTAR) 300 UNIT/ML Solostar Pen Inject 16 Units into the skin daily at 6 (six) AM. 11/04/23   Pender, Julie F, FNP  lisinopril  (ZESTRIL ) 5 MG tablet Take 1 tablet (5 mg total) by mouth daily. 11/04/23   Pender, Julie F, FNP  saxagliptin  HCl (ONGLYZA) 5 MG TABS tablet TAKE 1 TABLET BY MOUTH DAILY 10/19/23   Pender, Julie F, FNP    Scheduled Meds:  Chlorhexidine Gluconate Cloth  6 each Topical Daily   famotidine  20 mg Per Tube BID   heparin  5,000 Units Subcutaneous Q8H   mouth rinse  15 mL Mouth Rinse Q2H   Continuous Infusions:  sodium chloride     epinephrine 4 mcg/min (01/11/24 1945)   famotidine (PEPCID) IV     fentaNYL infusion INTRAVENOUS     lactated ringers     propofol (DIPRIVAN) infusion 5 mcg/kg/min (01/11/24 2004)   PRN Meds: docusate sodium, etomidate, fentaNYL, mouth rinse, polyethylene glycol, rocuronium  Allergies:    Allergies  Allergen Reactions   Mushroom Extract Complex (Obsolete)     Social History:   Social History   Socioeconomic History   Marital status: Married    Spouse  name: Not on file   Number of children: Not on file   Years of education: Not on file   Highest education level: Some college, no degree  Occupational History   Not on file  Tobacco Use   Smoking status: Never   Smokeless tobacco: Never  Substance and Sexual Activity   Alcohol use: Yes    Comment: rare   Drug use: No   Sexual activity: Yes  Other Topics Concern   Not on file  Social History Narrative   Not on file   Social Drivers of Health   Financial Resource Strain: Low Risk  (11/04/2023)   Overall Financial Resource Strain (CARDIA)    Difficulty of Paying Living Expenses: Not very hard  Food Insecurity: No Food Insecurity (11/04/2023)   Hunger  Vital Sign    Worried About Running Out of Food in the Last Year: Never true    Ran Out of Food in the Last Year: Never true  Transportation Needs: No Transportation Needs (11/04/2023)   PRAPARE - Administrator, Civil Service (Medical): No    Lack of Transportation (Non-Medical): No  Physical Activity: Sufficiently Active (11/04/2023)   Exercise Vital Sign    Days of Exercise per Week: 4 days    Minutes of Exercise per Session: 120 min  Stress: No Stress Concern Present (11/04/2023)   [employer] of Occupational Health - Occupational Stress Questionnaire    Feeling of Stress: Only a little  Social Connections: Socially Isolated (11/04/2023)   Social Connection and Isolation Panel    Frequency of Communication with Friends and Family: Once a week    Frequency of Social Gatherings with Friends and Family: Once a week    Attends Religious Services: Never    Database Administrator or Organizations: No    Attends Engineer, Structural: Not on file    Marital Status: Married  Catering Manager Violence: Not on file    Family History:     Family History  Problem Relation Age of Onset   Thyroid  disease Mother    Alcohol abuse Father    Drug abuse Father    Schizophrenia Father    Depression Father    Colon cancer Neg Hx    Stomach cancer Neg Hx      ROS:  Please see the history of present illness.   All other ROS reviewed and negative.     Physical Exam/Data: Vitals:   01/11/24 2015 01/11/24 2030 01/11/24 2045 01/11/24 2105  BP: (!) 170/91 (!) 159/93 (!) 165/93 (!) 169/93  Pulse: 91 84 81 76  Resp: 15 15 15 15   Temp: (!) 95.2 F (35.1 C) (!) 95.1 F (35.1 C) (!) 95.2 F (35.1 C) (!) 95.2 F (35.1 C)  SpO2: 100% 100% 100% 100%   No intake or output data in the 24 hours ending 01/11/24 2124    11/04/2023    1:55 PM 06/09/2023    7:40 AM 02/02/2023   10:22 AM  Last 3 Weights  Weight (lbs) 160 lb 9.6 oz 152 lb 14.4 oz 144 lb 9.6 oz  Weight (kg)  72.848 kg 69.355 kg 65.59 kg     There is no height or weight on file to calculate BMI.  General:  intubated, sedated HEENT: normal Neck: no JVD Cardiac:  normal S1, S2; RRR; no murmur  Lungs:  intubate Abd: soft Ext: no edema Musculoskeletal:  No deformities, BUE and BLE strength normal and equal Skin: warm and  dry    EKG:  The EKG was personally reviewed and demonstrates:  reivewed  Telemetry:  Telemetry was personally reviewed and demonstrates:  reviewed  Relevant CV Studies: reviewed  Laboratory Data: High Sensitivity Troponin:   Recent Labs  Lab 01/11/24 1951  TROPONINIHS 105*     Chemistry Recent Labs  Lab 01/11/24 1951 01/11/24 1953 01/11/24 2103  NA 135  137 137 138  K 3.8  3.9 3.9 3.2*  CL 101  104  --   --   CO2 15*  --   --   GLUCOSE 340*  348*  --   --   BUN 16  19  --   --   CREATININE 1.45*  1.20  --   --   CALCIUM 8.5*  --   --   MG 2.2  --   --   GFRNONAA >60  --   --   ANIONGAP 19*  --   --     Recent Labs  Lab 01/11/24 1951  PROT 6.0*  ALBUMIN 3.7  AST 237*  ALT 332*  ALKPHOS 53  BILITOT 0.9   Lipids  Recent Labs  Lab 01/11/24 1951  CHOL 160  TRIG 82  HDL 28*  LDLCALC 116*  CHOLHDL 5.7    Hematology Recent Labs  Lab 01/11/24 1951 01/11/24 1953 01/11/24 2103  WBC 10.5  --   --   RBC 5.17  --   --   HGB 15.3  15.6 15.3 15.6  HCT 45.3  46.0 45.0 46.0  MCV 87.6  --   --   MCH 29.6  --   --   MCHC 33.8  --   --   RDW 11.9  --   --   PLT 239  --   --    Thyroid  No results for input(s): TSH, FREET4 in the last 168 hours.  BNPNo results for input(s): BNP, PROBNP in the last 168 hours.  DDimer No results for input(s): DDIMER in the last 168 hours.  Radiology/Studies:  DG Abdomen 1 View Result Date: 01/11/2024 CLINICAL DATA:  OG tube placement EXAM: ABDOMEN - 1 VIEW COMPARISON:  None Available. FINDINGS: Enteric tube tip and side port overlie the stomach. Moderate air distension of the stomach.  IMPRESSION: Enteric tube tip and side port overlie the stomach. Electronically Signed   By: Luke Bun M.D.   On: 01/11/2024 20:33   DG Chest Portable 1 View Result Date: 01/11/2024 CLINICAL DATA:  Found down at work EXAM: PORTABLE CHEST 1 VIEW COMPARISON:  None Available. FINDINGS: Endotracheal tube tip about 5.1 cm superior to the carina. Enteric tube tip below the diaphragm but incompletely assessed. No acute airspace disease or effusion. Normal cardiac size. No pneumothorax IMPRESSION: Endotracheal tube tip about 5.1 cm superior to the carina. No acute airspace disease. Electronically Signed   By: Luke Bun M.D.   On: 01/11/2024 20:33     Assessment and Plan:  Child Campoy is a 42 y.o. male with a hx of hypertension, hyperlipidemia, type 2 diabetes who is being seen 01/11/2024 for the evaluation of cardiac arrest at the request of the emergency department.  Evidence of ischemia on ECG acute myocardial injury. Unclear if this is a type I event.  Risk factors for ACS include hyperlipidemia and type 2 diabetes.  Case discussed with ER and interventional attending. Unclear neurological function. If no evidence of bleeding, consider starting IV heparin.  Start IV heparin if no evidence of bleeding TTE ordered  Order A1C and BHB UDS evaluation Further management of post arrest per primary ICU team   Risk Assessment/Risk Scores:    TIMI Risk Score for Unstable Angina or Non-ST Elevation MI:   The patient's TIMI risk score is  , which indicates a  % risk of all cause mortality, new or recurrent myocardial infarction or need for urgent revascularization in the next 14 days.         For questions or updates, please contact [facility] Please consult [email or web address] for contact info under      Signed, Tyrelle Raczka A Samael Blades, MD  01/11/2024 9:24 PM

## 2024-01-11 NOTE — ED Provider Notes (Signed)
 [place] EMERGENCY DEPARTMENT AT [facility] Provider Note   CSN: [id number] Arrival date & time: 01/11/24  8060     Patient presents with: Post-CPR   Thomas Stephenson is a 43 y.o. male.  {Add pertinent medical, surgical, social history, OB history to HPI:420} 43 year old male with a history of diabetes and hypertension presents emergency department as a postarrest.  History obtained per EMS.  The patient was at work and collapsed.  Police have a video of him going down.  No significant trauma.  No reported drug use.  Unclear exactly how much downtime there was but EMS arrived and found him in asystole and started CPR.  He then went into PEA.  Did receive 1 dose of epinephrine and had CPR for a total of 18 minutes before ROSC was obtained.  Also was given fentanyl and Versed on the way over and started on epi drip.       Prior to Admission medications   Medication Sig Start Date End Date Taking? Authorizing Provider  BD PEN NEEDLE MICRO ULTRAFINE 32G X 6 MM MISC 1 EACH BY DOES NOT APPLY ROUTE DAILY AT 6 (SIX) AM. 08/05/23   Gareth Mliss FALCON, FNP  Continuous Glucose Sensor (DEXCOM G7 SENSOR) MISC Every 10 days 06/30/23   Pender, Julie F, FNP  glipiZIDE  (GLUCOTROL ) 10 MG tablet Take 1 tablet (10 mg total) by mouth 2 (two) times daily before a meal. 11/04/23   Gareth Mliss FALCON, FNP  Glucagon , rDNA, (GLUCAGON  EMERGENCY) 1 MG KIT Inject 1 mg into the muscle as needed (hypoglycemia). 10/06/23   Gareth Mliss FALCON, FNP  insulin  glargine, 2 Unit Dial, (TOUJEO  MAX SOLOSTAR) 300 UNIT/ML Solostar Pen Inject 16 Units into the skin daily at 6 (six) AM. 11/04/23   Gareth Mliss FALCON, FNP  lisinopril  (ZESTRIL ) 5 MG tablet Take 1 tablet (5 mg total) by mouth daily. 11/04/23   Gareth Mliss FALCON, FNP  saxagliptin  HCl (ONGLYZA) 5 MG TABS tablet TAKE 1 TABLET BY MOUTH DAILY 10/19/23   Pender, Julie F, FNP    Allergies: Mushroom extract complex (obsolete)    Review of Systems  Updated Vital Signs BP (!)  140/91   Pulse 74   Resp 14   SpO2 100%   Physical Exam Vitals reviewed: ***.     (all labs ordered are listed, but only abnormal results are displayed) Labs Reviewed  CBG MONITORING, ED - Abnormal; Notable for the following components:      Result Value   Glucose-Capillary 298 (*)    All other components within normal limits  I-STAT VENOUS BLOOD GAS, ED - Abnormal; Notable for the following components:   pCO2, Ven 30.0 (*)    pO2, Ven 154 (*)    Bicarbonate 15.1 (*)    TCO2 16 (*)    Acid-base deficit 10.0 (*)    Calcium, Ion 1.09 (*)    All other components within normal limits  I-STAT CHEM 8, ED - Abnormal; Notable for the following components:   Glucose, Bld 348 (*)    Calcium, Ion 1.09 (*)    TCO2 16 (*)    All other components within normal limits  MAGNESIUM  BRAIN NATRIURETIC PEPTIDE  ETHANOL  RAPID URINE DRUG SCREEN, HOSP PERFORMED  CBC WITH DIFFERENTIAL/PLATELET  PROTIME-INR  APTT  COMPREHENSIVE METABOLIC PANEL WITH GFR  LIPID PANEL  URINALYSIS, ROUTINE W REFLEX MICROSCOPIC  TRIGLYCERIDES  I-STAT CG4 LACTIC ACID, ED  I-STAT CG4 LACTIC ACID, ED  TROPONIN I (HIGH SENSITIVITY)  EKG: None  Radiology: No results found.  {Document cardiac monitor, telemetry assessment procedure when appropriate:32947} Procedure Name: Intubation Date/Time: 01/11/2024 8:06 PM  Performed by: Yolande Lamar BROCKS, MDPre-anesthesia Checklist: Patient identified, Emergency Drugs available, Suction available, Patient being monitored and Timeout performed Oxygen Delivery Method: Ambu bag Preoxygenation: Pre-oxygenation with 100% oxygen Induction Type: IV induction and Rapid sequence Ventilation: Mask ventilation without difficulty Laryngoscope Size: Glidescope and 4 Grade View: Grade I Tube size: 8.0 mm Number of attempts: 1 Airway Equipment and Method: Rigid stylet and Video-laryngoscopy Placement Confirmation: ETT inserted through vocal cords under direct vision, Positive  ETCO2, CO2 detector and Breath sounds checked- equal and bilateral Secured at: 22 cm Tube secured with: Tape Dental Injury: Teeth and Oropharynx as per pre-operative assessment         EMERGENCY DEPARTMENT US  CARDIAC EXAM Study: Limited Ultrasound of the Heart and Pericardium  INDICATIONS:Cardiac arrest Multiple views of the heart and pericardium were obtained in real-time with a multi-frequency probe.  PERFORMED AB:Fbdzoq IMAGES ARCHIVED?: No LIMITATIONS:  Body habitus VIEWS USED: Parasternal long axis and Parasternal short axis INTERPRETATION: Cardiac activity present, Pericardial effusioin absent, Cardiac tamponade absent, and Increased contractility   Medications Ordered in the ED  EPINEPHrine (ADRENALIN) 5 mg in NS 250 mL (0.02 mg/mL) premix infusion (4 mcg/min Intravenous New Bag/Given 01/11/24 1945)  0.9 %  sodium chloride infusion (has no administration in time range)  etomidate (AMIDATE) injection (20 mg Intravenous Given 01/11/24 1954)  rocuronium (ZEMURON) injection (100 mg Intravenous Given 01/11/24 1955)  fentaNYL 2500mcg in NS (10mcg/ml) infusion-PREMIX (has no administration in time range)  fentaNYL (SUBLIMAZE) bolus via infusion 25-100 mcg (has no administration in time range)  propofol (DIPRIVAN) 1000 MG/100ML infusion (has no administration in time range)    Clinical Course as of 01/11/24 2030  Mon Jan 11, 2024  2004 Dw Dr Dede from cardiology who does not feel that code STEMI should be activated.  Dr. Layman from ICU contacted regarding admission. [RP]  2013 Dr Gail from cardiology consulted. Recommends starting heparin after head ct.  [RP]  2028 Attempted to reach wife without response [RP]    Clinical Course User Index [RP] Yolande Lamar BROCKS, MD   {Click here for ABCD2, HEART and other calculators REFRESH Note before signing:1}                              Medical Decision Making Amount and/or Complexity of Data Reviewed Labs:  ordered. Radiology: ordered.  Risk Prescription drug management. Decision regarding hospitalization.   ***  {Document critical care time when appropriate  Document review of labs and clinical decision tools ie CHADS2VASC2, etc  Document your independent review of radiology images and any outside records  Document your discussion with family members, caretakers and with consultants  Document social determinants of health affecting pt's care  Document your decision making why or why not admission, treatments were needed:32947:::1}   Final diagnoses:  None    ED Discharge Orders     None

## 2024-01-11 NOTE — ED Notes (Signed)
 Assumed care on patient , IV sites/ETT /OGT intact , foley catheter unremarkable , waiting for ICU bed assignment .

## 2024-01-11 NOTE — ED Notes (Signed)
 Patient transported to CT

## 2024-01-11 NOTE — H&P (Signed)
 NAME:  Thomas Stephenson, MRN:  [id number], DOB:  [birth year], LOS: 0 ADMISSION DATE:  01/11/2024, CONSULTATION DATE:  01/11/24 REFERRING MD:  EDP, CHIEF COMPLAINT:  cardiac arrest   History of Present Illness:  43 yo male presented after sudden collapse, cardiac arrest and bystander initiated cpr. Pt has unknown overall down time, when EMS arrived they performed acls x1mins with rosc. All history is obtained from chart review as pt is intubated and unresponsive.  He is currently on 4mcg epi. CTH/neck pending. EKG with rbbb (no old comparisons) cardiology consulted and will see, no STMI activation but would like heparin infusion should cth result allow.   Pertinent  Medical History  dm2  Significant Hospital Events: Including procedures, antibiotic start and stop dates in addition to other pertinent events   Admitted to ICU 10/27  Interim History / Subjective:    Objective    Blood pressure (!) 170/91, pulse 91, temperature (!) 95.2 F (35.1 C), resp. rate 15, SpO2 100%.       No intake or output data in the 24 hours ending 01/11/24 2056 There were no vitals filed for this visit.  Examination: General: unresponsive on vent HENT: ncat perrla but sluggish, mmmp Lungs: ctab Cardiovascular: rrr Abdomen: soft, nt, nd bs + Extremities: no c/c/e Neuro: unresponsive on vent GU: deferred  Resolved problem list   Assessment and Plan  Oohca s/p rosc Rbbb Acute hypoxic resp failure req mechanical ventilation Transaminitis Nstemi Metabolic acidosis Aki Hyperglycemia with t2dm -titrate epi to off -f/u cth and neck, if no acute bleed will start heparin gtt -titrate vent -neuro exam frequently for prognosis -cardiology consult pending -echo in am -2 amps bicarb -ssi   Labs   CBC: Recent Labs  Lab 01/11/24 1951 01/11/24 1953  WBC 10.5  --   NEUTROABS 7.1  --   HGB 15.3  15.6 15.3  HCT 45.3  46.0 45.0  MCV 87.6  --   PLT 239  --     Basic Metabolic Panel: Recent  Labs  Lab 01/11/24 1951 01/11/24 1953  NA 137 137  K 3.9 3.9  CL 104  --   GLUCOSE 348*  --   BUN 19  --   CREATININE 1.20  --    GFR: CrCl cannot be calculated (Unknown ideal weight.). Recent Labs  Lab 01/11/24 1951  WBC 10.5    Liver Function Tests: No results for input(s): AST, ALT, ALKPHOS, BILITOT, PROT, ALBUMIN in the last 168 hours. No results for input(s): LIPASE, AMYLASE in the last 168 hours. No results for input(s): AMMONIA in the last 168 hours.  ABG    Component Value Date/Time   HCO3 15.1 (L) 01/11/2024 1953   TCO2 16 (L) 01/11/2024 1953   ACIDBASEDEF 10.0 (H) 01/11/2024 1953   O2SAT 99 01/11/2024 1953     Coagulation Profile: Recent Labs  Lab 01/11/24 1951  INR 1.1    Cardiac Enzymes: No results for input(s): CKTOTAL, CKMB, CKMBINDEX, TROPONINI in the last 168 hours.  HbA1C: Hemoglobin A1C  Date/Time Value Ref Range Status  06/09/2023 07:45 AM 5.6 4.0 - 5.6 % Final   Hgb A1c MFr Bld  Date/Time Value Ref Range Status  11/04/2023 02:24 PM 6.2 (H) <5.7 % Final    Comment:    For someone without known diabetes, a hemoglobin  A1c value between 5.7% and 6.4% is consistent with prediabetes and should be confirmed with a  follow-up test. . For someone with known diabetes, a value <7% indicates  that their diabetes is well controlled. A1c targets should be individualized based on duration of diabetes, age, comorbid conditions, and other considerations. . This assay result is consistent with an increased risk of diabetes. . Currently, no consensus exists regarding use of hemoglobin A1c for diagnosis of diabetes for children. .   02/02/2023 11:03 AM 9.6 (H) <5.7 % of total Hgb Final    Comment:    For someone without known diabetes, a hemoglobin A1c value of 6.5% or greater indicates that they may have  diabetes and this should be confirmed with a follow-up  test. . For someone with known diabetes, a value <7%  indicates  that their diabetes is well controlled and a value  greater than or equal to 7% indicates suboptimal  control. A1c targets should be individualized based on  duration of diabetes, age, comorbid conditions, and  other considerations. . Currently, no consensus exists regarding use of hemoglobin A1c for diagnosis of diabetes for children. .     CBG: Recent Labs  Lab 01/11/24 1945  GLUCAP 298*    Review of Systems:   unobtainable  Past Medical History:  He,  has a past medical history of Diabetes mellitus without complication (HCC) and Hypertension.   Surgical History:  No past surgical history on file.   Social History:   reports that he has never smoked. He has never used smokeless tobacco. He reports current alcohol use. He reports that he does not use drugs.   Family History:  His family history includes Alcohol abuse in his father; Depression in his father; Drug abuse in his father; Schizophrenia in his father; Thyroid  disease in his mother. There is no history of Colon cancer or Stomach cancer.   Allergies Allergies  Allergen Reactions   Mushroom Extract Complex (Obsolete)      Home Medications  Prior to Admission medications   Medication Sig Start Date End Date Taking? Authorizing Provider  BD PEN NEEDLE MICRO ULTRAFINE 32G X 6 MM MISC 1 EACH BY DOES NOT APPLY ROUTE DAILY AT 6 (SIX) AM. 08/05/23   Gareth Mliss FALCON, FNP  Continuous Glucose Sensor (DEXCOM G7 SENSOR) MISC Every 10 days 06/30/23   Pender, Julie F, FNP  glipiZIDE  (GLUCOTROL ) 10 MG tablet Take 1 tablet (10 mg total) by mouth 2 (two) times daily before a meal. 11/04/23   Gareth Mliss FALCON, FNP  Glucagon , rDNA, (GLUCAGON  EMERGENCY) 1 MG KIT Inject 1 mg into the muscle as needed (hypoglycemia). 10/06/23   Gareth Mliss FALCON, FNP  insulin  glargine, 2 Unit Dial, (TOUJEO  MAX SOLOSTAR) 300 UNIT/ML Solostar Pen Inject 16 Units into the skin daily at 6 (six) AM. 11/04/23   Pender, Julie F, FNP  lisinopril   (ZESTRIL ) 5 MG tablet Take 1 tablet (5 mg total) by mouth daily. 11/04/23   Gareth Mliss FALCON, FNP  saxagliptin  HCl (ONGLYZA) 5 MG TABS tablet TAKE 1 TABLET BY MOUTH DAILY 10/19/23   Gareth Mliss FALCON, FNP     Critical care time: 

## 2024-01-11 NOTE — Progress Notes (Signed)
 Pt transported on ventilator to CT and back w/o complication.

## 2024-01-11 NOTE — ED Triage Notes (Signed)
 Found down at work, unsure how long he was down, fecal and urine incontinence. Bystander started and placed AED which advised and they continued CPR. Fire and EMS also performed CPR for a total of 18 mins. 1 epi given Initial asystole, then PEA, then ROSC. Initially wide complex, narrowed over time IO right tib 18 L wrist 18 L forearm  CBG 206 147/96 Cap[place] 24

## 2024-01-11 NOTE — ED Notes (Signed)
 Patient noted to be moving extremities and gagging around ET tube on the way back from CT, medication adjusted appropriately.

## 2024-01-11 NOTE — ED Notes (Signed)
 Patient gagging around tube, propofol increased.

## 2024-01-12 ENCOUNTER — Inpatient Hospital Stay (HOSPITAL_COMMUNITY)

## 2024-01-12 DIAGNOSIS — J9601 Acute respiratory failure with hypoxia: Secondary | ICD-10-CM | POA: Diagnosis not present

## 2024-01-12 DIAGNOSIS — G40401 Other generalized epilepsy and epileptic syndromes, not intractable, with status epilepticus: Secondary | ICD-10-CM

## 2024-01-12 DIAGNOSIS — G931 Anoxic brain damage, not elsewhere classified: Secondary | ICD-10-CM | POA: Diagnosis not present

## 2024-01-12 DIAGNOSIS — R7401 Elevation of levels of liver transaminase levels: Secondary | ICD-10-CM | POA: Diagnosis not present

## 2024-01-12 DIAGNOSIS — I469 Cardiac arrest, cause unspecified: Secondary | ICD-10-CM

## 2024-01-12 LAB — ECHOCARDIOGRAM COMPLETE
AR max vel: 1.81 cm2
AV Area VTI: 1.94 cm2
AV Area mean vel: 1.63 cm2
AV Mean grad: 5 mmHg
AV Peak grad: 7.5 mmHg
Ao pk vel: 1.37 m/s
Area-P 1/2: 4.26 cm2
Calc EF: 56.7 %
Height: 69 in
S' Lateral: 2.9 cm
Single Plane A2C EF: 59.3 %
Single Plane A4C EF: 53.9 %
Weight: 2567.92 [oz_av]

## 2024-01-12 LAB — MRSA NEXT GEN BY PCR, NASAL: MRSA by PCR Next Gen: NOT DETECTED

## 2024-01-12 LAB — CBG MONITORING, ED
Glucose-Capillary: 149 mg/dL — ABNORMAL HIGH (ref 70–99)
Glucose-Capillary: 169 mg/dL — ABNORMAL HIGH (ref 70–99)
Glucose-Capillary: 265 mg/dL — ABNORMAL HIGH (ref 70–99)
Glucose-Capillary: 396 mg/dL — ABNORMAL HIGH (ref 70–99)

## 2024-01-12 LAB — BASIC METABOLIC PANEL WITH GFR
Anion gap: 13 (ref 5–15)
BUN: 20 mg/dL (ref 6–20)
CO2: 20 mmol/L — ABNORMAL LOW (ref 22–32)
Calcium: 8.3 mg/dL — ABNORMAL LOW (ref 8.9–10.3)
Chloride: 103 mmol/L (ref 98–111)
Creatinine, Ser: 1.16 mg/dL (ref 0.61–1.24)
GFR, Estimated: >60 mL/min (ref >60)
Glucose, Bld: 328 mg/dL — ABNORMAL HIGH (ref 70–99)
Potassium: 4.6 mmol/L (ref 3.5–5.1)
Sodium: 136 mmol/L (ref 135–145)

## 2024-01-12 LAB — PHOSPHORUS: Phosphorus: 2.3 mg/dL — ABNORMAL LOW (ref 2.5–4.6)

## 2024-01-12 LAB — HEPARIN LEVEL (UNFRACTIONATED)
Heparin Unfractionated: 0.26 [IU]/mL — ABNORMAL LOW (ref 0.30–0.70)
Heparin Unfractionated: 0.53 [IU]/mL (ref 0.30–0.70)
Heparin Unfractionated: >1.1 [IU]/mL — ABNORMAL HIGH (ref 0.30–0.70)

## 2024-01-12 LAB — CBC
HCT: 43.1 % (ref 39.0–52.0)
HCT: 40.3 % (ref 39.0–52.0)
Hemoglobin: 15.1 g/dL (ref 13.0–17.0)
Hemoglobin: 13.4 g/dL (ref 13.0–17.0)
MCH: 30 pg (ref 26.0–34.0)
MCH: 29.1 pg (ref 26.0–34.0)
MCHC: 35 g/dL (ref 30.0–36.0)
MCHC: 33.3 g/dL (ref 30.0–36.0)
MCV: 85.7 fL (ref 80.0–100.0)
MCV: 87.6 fL (ref 80.0–100.0)
Platelets: 232 K/uL (ref 150–400)
Platelets: 186 K/uL (ref 150–400)
RBC: 5.03 MIL/uL (ref 4.22–5.81)
RBC: 4.6 MIL/uL (ref 4.22–5.81)
RDW: 11.9 % (ref 11.5–15.5)
RDW: 11.9 % (ref 11.5–15.5)
WBC: 16.7 K/uL — ABNORMAL HIGH (ref 4.0–10.5)
WBC: 13.8 K/uL — ABNORMAL HIGH (ref 4.0–10.5)
nRBC: 0 % (ref 0.0–0.2)
nRBC: 0 % (ref 0.0–0.2)

## 2024-01-12 LAB — TRIGLYCERIDES: Triglycerides: 87 mg/dL (ref <150)

## 2024-01-12 LAB — CREATININE, SERUM
Creatinine, Ser: 1.34 mg/dL — ABNORMAL HIGH (ref 0.61–1.24)
GFR, Estimated: >60 mL/min (ref >60)

## 2024-01-12 LAB — GLUCOSE, CAPILLARY
Glucose-Capillary: 175 mg/dL — ABNORMAL HIGH (ref 70–99)
Glucose-Capillary: 156 mg/dL — ABNORMAL HIGH (ref 70–99)
Glucose-Capillary: 178 mg/dL — ABNORMAL HIGH (ref 70–99)

## 2024-01-12 LAB — I-STAT CG4 LACTIC ACID, ED: Lactic Acid, Venous: 6 mmol/L (ref 0.5–1.9)

## 2024-01-12 LAB — HIV ANTIBODY (ROUTINE TESTING W REFLEX): HIV Screen 4th Generation wRfx: NONREACTIVE

## 2024-01-12 LAB — MAGNESIUM: Magnesium: 1.7 mg/dL (ref 1.7–2.4)

## 2024-01-12 LAB — TROPONIN I (HIGH SENSITIVITY): Troponin I (High Sensitivity): 1126 ng/L (ref <18)

## 2024-01-12 MED ORDER — ACETAMINOPHEN 160 MG/5ML PO SOLN
650.0000 mg | ORAL | Status: DC
  Administered 2024-01-12 – 2024-01-15 (×15): 650 mg
  Filled 2024-01-12 (×14): qty 20.3

## 2024-01-12 MED ORDER — SODIUM CHLORIDE 0.9 % IV SOLN
3.0000 g | Freq: Four times a day (QID) | INTRAVENOUS | Status: DC
  Administered 2024-01-12 – 2024-01-15 (×14): 3 g via INTRAVENOUS
  Filled 2024-01-12 (×14): qty 8

## 2024-01-12 MED ORDER — DEXTROSE 5 % IV SOLN
500.0000 mg | Freq: Two times a day (BID) | INTRAVENOUS | Status: DC
  Administered 2024-01-12 – 2024-01-15 (×7): 500 mg via INTRAVENOUS
  Filled 2024-01-12: qty 500
  Filled 2024-01-12 (×3): qty 5
  Filled 2024-01-12: qty 500
  Filled 2024-01-12 (×2): qty 5
  Filled 2024-01-12 (×2): qty 500
  Filled 2024-01-12 (×2): qty 5

## 2024-01-12 MED ORDER — ASPIRIN 81 MG PO CHEW
81.0000 mg | CHEWABLE_TABLET | Freq: Every day | ORAL | Status: DC
  Administered 2024-01-13 – 2024-01-15 (×3): 81 mg
  Filled 2024-01-12 (×3): qty 1

## 2024-01-12 MED ORDER — BUSPIRONE HCL 15 MG PO TABS
30.0000 mg | ORAL_TABLET | Freq: Three times a day (TID) | ORAL | Status: DC
  Administered 2024-01-14: 30 mg
  Filled 2024-01-12 (×9): qty 2

## 2024-01-12 MED ORDER — LEVETIRACETAM (KEPPRA) 500 MG/5 ML ADULT IV PUSH
500.0000 mg | Freq: Two times a day (BID) | INTRAVENOUS | Status: DC
  Administered 2024-01-12 – 2024-01-15 (×7): 500 mg via INTRAVENOUS
  Filled 2024-01-12 (×7): qty 5

## 2024-01-12 MED ORDER — PROPOFOL 1000 MG/100ML IV EMUL
0.0000 ug/kg/min | INTRAVENOUS | Status: DC
  Administered 2024-01-12: 70 ug/kg/min via INTRAVENOUS
  Administered 2024-01-12: 50 ug/kg/min via INTRAVENOUS
  Administered 2024-01-12 – 2024-01-13 (×5): 70 ug/kg/min via INTRAVENOUS
  Administered 2024-01-13: 60 ug/kg/min via INTRAVENOUS
  Administered 2024-01-13: 40 ug/kg/min via INTRAVENOUS
  Administered 2024-01-14: 20 ug/kg/min via INTRAVENOUS
  Administered 2024-01-14: 40 ug/kg/min via INTRAVENOUS
  Administered 2024-01-14: 20 ug/kg/min via INTRAVENOUS
  Administered 2024-01-14 – 2024-01-15 (×4): 40 ug/kg/min via INTRAVENOUS
  Filled 2024-01-12 (×7): qty 100
  Filled 2024-01-12: qty 200
  Filled 2024-01-12: qty 100
  Filled 2024-01-12: qty 200
  Filled 2024-01-12 (×4): qty 100

## 2024-01-12 MED ORDER — ACETAMINOPHEN 650 MG RE SUPP: 650.0000 mg | RECTAL | Status: DC

## 2024-01-12 MED ORDER — ASPIRIN 325 MG PO TABS
325.0000 mg | ORAL_TABLET | Freq: Once | ORAL | Status: AC
  Administered 2024-01-12: 325 mg
  Filled 2024-01-12: qty 1

## 2024-01-12 MED ORDER — MAGNESIUM SULFATE 2 GM/50ML IV SOLN
2.0000 g | Freq: Once | INTRAVENOUS | Status: AC
  Administered 2024-01-12: 2 g via INTRAVENOUS
  Filled 2024-01-12: qty 50

## 2024-01-12 MED ORDER — LEVETIRACETAM (KEPPRA) 500 MG/5 ML ADULT IV PUSH
3000.0000 mg | Freq: Once | INTRAVENOUS | Status: AC
  Administered 2024-01-12: 3000 mg via INTRAVENOUS
  Filled 2024-01-12: qty 30

## 2024-01-12 MED ORDER — BUSPIRONE HCL 15 MG PO TABS
30.0000 mg | ORAL_TABLET | Freq: Three times a day (TID) | ORAL | Status: DC
  Administered 2024-01-12 – 2024-01-15 (×10): 30 mg
  Filled 2024-01-12: qty 2
  Filled 2024-01-12: qty 3
  Filled 2024-01-12 (×5): qty 2

## 2024-01-12 MED ORDER — NOREPINEPHRINE 4 MG/250ML-% IV SOLN
0.0000 ug/min | INTRAVENOUS | Status: DC
Start: 2024-01-12 — End: 2024-01-16

## 2024-01-12 MED ORDER — ACETAMINOPHEN 160 MG/5ML PO SOLN
650.0000 mg | ORAL | Status: DC | PRN
  Filled 2024-01-12: qty 20.3

## 2024-01-12 MED ORDER — METOPROLOL TARTRATE 5 MG/5ML IV SOLN
2.5000 mg | INTRAVENOUS | Status: DC | PRN
  Administered 2024-01-12 – 2024-01-15 (×3): 2.5 mg via INTRAVENOUS
  Filled 2024-01-12 (×3): qty 5

## 2024-01-12 MED ORDER — LACTATED RINGERS IV BOLUS
1000.0000 mL | Freq: Once | INTRAVENOUS | Status: AC
  Administered 2024-01-12: 1000 mL via INTRAVENOUS

## 2024-01-12 MED ORDER — FENTANYL CITRATE (PF) 50 MCG/ML IJ SOSY
25.0000 ug | PREFILLED_SYRINGE | INTRAMUSCULAR | Status: DC | PRN
  Administered 2024-01-13: 100 ug via INTRAVENOUS
  Administered 2024-01-13: 50 ug via INTRAVENOUS
  Administered 2024-01-14 (×2): 100 ug via INTRAVENOUS
  Administered 2024-01-15: 50 ug via INTRAVENOUS
  Filled 2024-01-12: qty 2
  Filled 2024-01-12: qty 1
  Filled 2024-01-12: qty 2
  Filled 2024-01-12: qty 1
  Filled 2024-01-12: qty 2

## 2024-01-12 MED ORDER — K PHOS MONO-SOD PHOS DI & MONO 155-852-130 MG PO TABS
500.0000 mg | ORAL_TABLET | ORAL | Status: AC
  Administered 2024-01-12 (×3): 500 mg
  Filled 2024-01-12 (×5): qty 2

## 2024-01-12 MED ORDER — VALPROATE SODIUM 100 MG/ML IV SOLN
1500.0000 mg | Freq: Once | INTRAVENOUS | Status: AC
  Administered 2024-01-12: 1500 mg via INTRAVENOUS
  Filled 2024-01-12: qty 15

## 2024-01-12 MED ORDER — NITROGLYCERIN 2 % TD OINT
0.5000 [in_us] | TOPICAL_OINTMENT | Freq: Four times a day (QID) | TRANSDERMAL | Status: AC
Start: 2024-01-12 — End: 2024-01-14
  Administered 2024-01-12 – 2024-01-14 (×8): 0.5 [in_us] via TOPICAL
  Filled 2024-01-12: qty 30

## 2024-01-12 MED ORDER — HEPARIN (PORCINE) 25000 UT/250ML-% IV SOLN
1000.0000 [IU]/h | INTRAVENOUS | Status: DC
  Administered 2024-01-12: 900 [IU]/h via INTRAVENOUS
  Administered 2024-01-13 (×2): 1000 [IU]/h via INTRAVENOUS
  Filled 2024-01-12 (×2): qty 250

## 2024-01-12 MED ORDER — METOPROLOL TARTRATE 5 MG/5ML IV SOLN: 2.5000 mg | INTRAVENOUS | Status: DC | PRN

## 2024-01-12 MED ORDER — SODIUM CHLORIDE 0.9 % IV SOLN
250.0000 mL | INTRAVENOUS | Status: AC
  Administered 2024-01-12: 250 mL via INTRAVENOUS

## 2024-01-12 MED ORDER — HEPARIN BOLUS VIA INFUSION
4000.0000 [IU] | Freq: Once | INTRAVENOUS | Status: DC
  Filled 2024-01-12: qty 4000

## 2024-01-12 MED ORDER — MIDAZOLAM HCL (PF) 2 MG/2ML IJ SOLN
2.0000 mg | INTRAMUSCULAR | Status: DC | PRN
  Administered 2024-01-13 – 2024-01-15 (×3): 2 mg via INTRAVENOUS
  Filled 2024-01-12: qty 2

## 2024-01-12 MED ORDER — ACETAMINOPHEN 325 MG PO TABS
650.0000 mg | ORAL_TABLET | ORAL | Status: DC
  Administered 2024-01-12 – 2024-01-15 (×5): 650 mg
  Filled 2024-01-12 (×5): qty 2

## 2024-01-12 MED ORDER — MIDAZOLAM HCL (PF) 2 MG/2ML IJ SOLN
2.0000 mg | INTRAMUSCULAR | Status: DC | PRN
  Administered 2024-01-15: 2 mg via INTRAVENOUS
  Filled 2024-01-12 (×3): qty 2

## 2024-01-12 NOTE — Progress Notes (Signed)
  Echocardiogram 2D Echocardiogram has been performed.  Norleen ORN [facility] 01/12/2024, 8:24 AM

## 2024-01-12 NOTE — Consult Note (Signed)
 NEUROLOGY CONSULT NOTE   Date of service: January 12, 2024 Patient Name: Thomas Stephenson MRN:  [id number] DOB:  Aug 14, 1980 Chief Complaint: Myoclonic jerking postcardiac arrest Requesting Provider: Jude Harden GAILS, MD  History of Present Illness  Thomas Stephenson is a 43 y.o. male with hx of diabetes and hypertension, brought into the ED after sudden collapse, cardiac arrest and bystander initiated CPR.  Overall downtime is unknown but the wife at bedside says that he was at work and was seem to slump over on the camera.  CPR was started as soon as it was determined that he was unresponsive.  When EMS arrived they performed ACLS for 18 minutes with ROSC.  The timeline prior to EMS arrival is somewhat unclear. He is being admitted to the ICU service.  On morning rounds, it was noted that as sedation was lowered, he had myoclonic movements involving his face arms and legs as well as trunk. Wife at bedside provided history.  Denied any preceding illness or sickness.  Does not have a family history of sudden cardiac events.    ROS  Unable to ascertain due to sedated intubated status  Past History   Past Medical History:  Diagnosis Date   Diabetes mellitus without complication (HCC)    Hypertension     No past surgical history on file.  Family History: Family History  Problem Relation Age of Onset   Thyroid  disease Mother    Alcohol abuse Father    Drug abuse Father    Schizophrenia Father    Depression Father    Colon cancer Neg Hx    Stomach cancer Neg Hx     Social History  reports that he has never smoked. He has never used smokeless tobacco. He reports current alcohol use. He reports that he does not use drugs.  Allergies  Allergen Reactions   Mushroom Extract Complex (Obsolete)     Unknown reaction    Medications   Current Facility-Administered Medications:    0.9 %  sodium chloride infusion, , Intravenous, Continuous, Yolande Lamar BROCKS, MD   0.9 %  sodium chloride  infusion, 250 mL, Intravenous, Continuous, Jude, Harden GAILS, MD   acetaminophen (TYLENOL) 160 MG/5ML solution 650 mg, 650 mg, Per Tube, Q4H PRN, Alva, Rakesh V, MD   acetaminophen (TYLENOL) tablet 650 mg, 650 mg, Per Tube, Q4H **OR** acetaminophen (TYLENOL) 160 MG/5ML solution 650 mg, 650 mg, Per Tube, Q4H **OR** acetaminophen (TYLENOL) suppository 650 mg, 650 mg, Rectal, Q4H, Alva, Rakesh V, MD   busPIRone (BUSPAR) tablet 30 mg, 30 mg, Per Tube, Q8H **OR** busPIRone (BUSPAR) tablet 30 mg, 30 mg, Per Tube, Q8H, Alva, Rakesh V, MD   Chlorhexidine Gluconate Cloth 2 % PADS 6 each, 6 each, Topical, Daily, Layman Raisin, DO, 6 each at 01/12/24 0826   docusate (COLACE) 50 MG/5ML liquid 100 mg, 100 mg, Per Tube, BID PRN, Layman Raisin, DO   etomidate (AMIDATE) injection, , Intravenous, Code/Trauma/Sedation Med, Yolande Lamar BROCKS, MD, 20 mg at 01/11/24 1954   famotidine (PEPCID) tablet 20 mg, 20 mg, Per Tube, BID, Layman Raisin, DO   fentaNYL (SUBLIMAZE) injection 25-100 mcg, 25-100 mcg, Intravenous, Q2H PRN, Alva, Rakesh V, MD   heparin ADULT infusion 100 units/mL (25000 units/250mL), 900 Units/hr, Intravenous, Continuous, Laron Agent, [facility], Last Rate: 9 mL/hr at 01/12/24 0834, 900 Units/hr at 01/12/24 0834   insulin  aspart (novoLOG) injection 0-15 Units, 0-15 Units, Subcutaneous, Q4H, Layman Raisin, DO, 3 Units at 01/12/24 9187   lactated ringers bolus 1,000 mL,  1,000 mL, Intravenous, Once, Jude Harden GAILS, MD   lactated ringers infusion, , Intravenous, Continuous, Layman Raisin, DO, Last Rate: 125 mL/hr at 01/12/24 0834, Infusion Verify at 01/12/24 0834   levETIRAcetam (KEPPRA) undiluted injection 3,000 mg, 3,000 mg, Intravenous, Once, Willena Jeancharles, MD   levETIRAcetam (KEPPRA) undiluted injection 500 mg, 500 mg, Intravenous, Q12H, Carson Bogden, MD   magnesium sulfate IVPB 2 g 50 mL, 2 g, Intravenous, Once, Hurth, Kimberly P, RPH   midazolam PF (VERSED) injection 2 mg, 2 mg,  Intravenous, Q15 min PRN, Alva, Rakesh V, MD   midazolam PF (VERSED) injection 2 mg, 2 mg, Intravenous, Q2H PRN, Alva, Rakesh V, MD   norepinephrine (LEVOPHED) 4mg  in (0.016 mg/mL) premix infusion, 0-10 mcg/min, Intravenous, Titrated, Jude Harden GAILS, MD   Oral care mouth rinse, 15 mL, Mouth Rinse, Q2H, Marshall, Jessica, DO, 15 mL at 01/12/24 9167   Oral care mouth rinse, 15 mL, Mouth Rinse, PRN, Layman Raisin, DO   phosphorus (K PHOS NEUTRAL) tablet 500 mg, 500 mg, Per Tube, Q4H, Hurth, Kimberly P, RPH   polyethylene glycol (MIRALAX / GLYCOLAX) packet 17 g, 17 g, Per Tube, Daily PRN, Layman Raisin, DO   propofol (DIPRIVAN) 1000 MG/100ML infusion, 0-50 mcg/kg/min, Intravenous, Continuous, Jude Harden GAILS, MD   rocuronium (ZEMURON) injection, , Intravenous, Code/Trauma/Sedation Med, Yolande Lamar BROCKS, MD, 100 mg at 01/11/24 1955  Current Outpatient Medications:    Continuous Glucose Sensor (DEXCOM G7 SENSOR) MISC, Every 10 days, Disp: 9 each, Rfl: 10   glipiZIDE  (GLUCOTROL ) 10 MG tablet, Take 1 tablet (10 mg total) by mouth 2 (two) times daily before a meal., Disp: 180 tablet, Rfl: 1   insulin  glargine, 2 Unit Dial, (TOUJEO  MAX SOLOSTAR) 300 UNIT/ML Solostar Pen, Inject 16 Units into the skin daily at 6 (six) AM., Disp: 15 mL, Rfl: 5   lisinopril  (ZESTRIL ) 5 MG tablet, Take 1 tablet (5 mg total) by mouth daily., Disp: 90 tablet, Rfl: 1   saxagliptin  HCl (ONGLYZA) 5 MG TABS tablet, TAKE 1 TABLET BY MOUTH DAILY, Disp: 90 tablet, Rfl: 0   BD PEN NEEDLE MICRO ULTRAFINE 32G X 6 MM MISC, 1 EACH BY DOES NOT APPLY ROUTE DAILY AT 6 (SIX) AM., Disp: 100 each, Rfl: 1   Glucagon , rDNA, (GLUCAGON  EMERGENCY) 1 MG KIT, Inject 1 mg into the muscle as needed (hypoglycemia)., Disp: 1 kit, Rfl: 5  Vitals   Vitals:   01/12/24 0715 01/12/24 0749 01/12/24 0800 01/12/24 0845  BP: 117/70  112/68 107/64  Pulse: 94  94 93  Resp: 19  15 18   Temp: (!) 100.8 F (38.2 C)  (!) 100.9 F (38.3 C) (!) 100.9  F (38.3 C)  SpO2: 100% 100% 100% 100%  Weight:      Height:        Body mass index is 23.7 kg/m.   Physical Exam   General: Well-developed well-nourished man, sedated intubated.  Sedation with propofol held for exam HEENT: Normocephalic atraumatic Lungs: Vented Vascular: Regular rate rhythm Neurological exam Sedation held for exam Intubated Even on 70 mcg/kg/min of propofol, had some eyelid twitching as well as myoclonic twitching involving the limbs. Upon holding sedation, the myoclonic twitching was much more frequent Pupils are sluggishly reactive to light.  Gaze is midline.  Oculocephalics present.  Corneal reflexes are present sluggish bilaterally. He is breathing over the ventilator Motor examination: No purposeful movement.  No withdrawal to noxious stimulation. Sensory exam: As above  Labs/Imaging/Neurodiagnostic studies   CBC:  Recent Labs  Lab 01/11/24 1951 01/11/24 1953 01/11/24 2103 01/12/24 0251  WBC 10.5  --   --  16.7*  NEUTROABS 7.1  --   --   --   HGB 15.3  15.6   < > 15.6 15.1  HCT 45.3  46.0   < > 46.0 43.1  MCV 87.6  --   --  85.7  PLT 239  --   --  232   < > = values in this interval not displayed.   Basic Metabolic Panel:  Lab Results  Component Value Date   NA 136 01/12/2024   K 4.6 01/12/2024   CO2 20 (L) 01/12/2024   GLUCOSE 328 (H) 01/12/2024   BUN 20 01/12/2024   CREATININE 1.16 01/12/2024   CALCIUM 8.3 (L) 01/12/2024   GFRNONAA >60 01/12/2024   Lipid Panel:  Lab Results  Component Value Date   LDLCALC 116 (H) 01/11/2024   HgbA1c:  Lab Results  Component Value Date   HGBA1C 6.2 (H) 11/04/2023   Urine Drug Screen:     Component Value Date/Time   LABOPIA NONE DETECTED 01/11/2024 1958   COCAINSCRNUR NONE DETECTED 01/11/2024 1958   LABBENZ POSITIVE (A) 01/11/2024 1958   AMPHETMU NONE DETECTED 01/11/2024 1958   THCU NONE DETECTED 01/11/2024 1958   LABBARB NONE DETECTED 01/11/2024 1958    Alcohol Level     Component  Value Date/Time   ETH <15 01/11/2024 1951   INR  Lab Results  Component Value Date   INR 1.1 01/11/2024   APTT  Lab Results  Component Value Date   APTT 28 01/11/2024   CT Head without contrast(Personally reviewed): No acute findings  ASSESSMENT   Rajendra Spiller is a 43 y.o. male with a history of diabetes and hypertension who had a out-of-hospital cardiac arrest with an unknown downtime prior to EMS arrival and then about 18 minutes of CPR by EMS prior to ROSC. He is currently exhibiting what appears to be myoclonic jerking movements.  My suspicion is for anoxic brain injury causing myoclonic status epilepticus. I discussed with the family in detail-myoclonic seizures usually portend poor prognosis but we will continue to monitor him for at least 72 hours prior to formal prognostication.  Impression: Evaluate for anoxic brain injury status postcardiac arrest  RECOMMENDATIONS  LTM EEG Load with Keppra-3 g IV x 1 ordered. Start Keppra 500 twice daily from this evening. Continue propofol-currently at 70 mcg/kg/min If he continues to have myoclonic jerking on propofol and Keppra, will add Depakote. Next steps will be to escalate propofol dose and add Klonopin per tube. Formal prognostication not prior to 72 hours from the cardiac arrest.  MRI also not prior to 72 hours from cardiac arrest if needed.  Plan discussed with Dr. Alva. Plan also discussed with patient's wife, and wife's aunt at bedside.  ______________________________________________________________________    Bonney Eligio Lav, MD [facility]   CRITICAL CARE ATTESTATION Performed by: Eligio Lav, MD Total critical care time: 39 minutes Critical care time was exclusive of separately billable procedures and treating other patients and/or supervising APPs/Residents/Students Critical care was necessary to treat or prevent imminent or life-threatening deterioration. This patient is critically ill and  at significant risk for neurological worsening and/or death and care requires constant monitoring. Critical care was time spent personally by me on the following activities: development of treatment plan with patient and/or surrogate as well as nursing, discussions with consultants, evaluation of patient's response to treatment, examination of patient, obtaining history from patient or  surrogate, ordering and performing treatments and interventions, ordering and review of laboratory studies, ordering and review of radiographic studies, pulse oximetry, re-evaluation of patient's condition, participation in multidisciplinary rounds and medical decision making of high complexity in the care of this patient.

## 2024-01-12 NOTE — Progress Notes (Signed)
 Pharmacy Electrolyte Replacement  Recent Labs:  Recent Labs    01/12/24 0251  K 4.6  MG 1.7  PHOS 2.3*  CREATININE 1.16    Low Critical Values (K </= 2.5, Phos </= 1, Mg </= 1) Present: Mg = 1.7 and Phos = 2.3  Plan: Ordered Mag 2g IV once and K Phos Neutral 500 mg every 4 hours x3 doses as per protocol/pharmacy consult.  Thank you for allowing pharmacy to participate in this patient's care,  Suzen Sour, PharmD, BCCCP Clinical Pharmacist  Phone: [phone] 01/12/2024 9:18 AM  Please check AMION for all [facility] Pharmacy phone numbers After 10:00 PM, call Main Pharmacy [phone]

## 2024-01-12 NOTE — Progress Notes (Signed)
 PHARMACY - ANTICOAGULATION CONSULT NOTE  Pharmacy Consult for heparin Indication: chest pain/ACS  Allergies  Allergen Reactions   Mushroom Extract Complex (Obsolete)     Unknown reaction    Patient Measurements: Height: 5' 9 (175.3 cm) Weight: 72.8 kg (160 lb 7.9 oz) (Wt from 11/04/2023) IBW/kg (Calculated) : 70.7 HEPARIN DW (KG): 72.8  Vital Signs: Temp: 98.5 F (36.9 C) (10/28 1216) Temp Source: Bladder (10/28 1216) BP: 152/88 (10/28 1245) Pulse Rate: 86 (10/28 1245)  Labs: Recent Labs    01/11/24 1951 01/11/24 1953 01/11/24 2103 01/11/24 2330 01/12/24 0251 01/12/24 1105 01/12/24 1209  HGB 15.3  15.6   < > 15.6  --  15.1 13.4  --   HCT 45.3  46.0   < > 46.0  --  43.1 40.3  --   PLT 239  --   --   --  232 186  --   APTT 28  --   --   --   --   --   --   LABPROT 14.5  --   --   --   --   --   --   INR 1.1  --   --   --   --   --   --   HEPARINUNFRC  --   --   --   --   --  >1.10* 0.26*  CREATININE 1.45*  1.20  --   --  1.34* 1.16  --   --   TROPONINIHS 105*  --   --  1,126*  --   --   --    < > = values in this interval not displayed.    Estimated Creatinine Clearance: 80.4 mL/min (by C-G formula based on SCr of 1.16 mg/dL).   Medical History: Past Medical History:  Diagnosis Date   Diabetes mellitus without complication (HCC)    Hypertension    Assessment: 58 yoM presented after collapsed and OOH cardiac arrest with downtime unknown now s/p ROSC. Pharmacy consulted to dose heparin for ACS.  -CBC WNL -No PTA oral anticoagulation -Trop 1100; ECG sinus w/ RBBB  HL 0.26- subtherapeutic on 900 units/hr  Goal of Therapy:  Heparin level 0.3-0.7 units/ml Monitor platelets by anticoagulation protocol: Yes   Plan:  Increase heparin infusion to 1000 units/hr Check anti-Xa level in 6 hours and daily while on heparin Continue to monitor H&H and platelets  Sharyne Glatter, PharmD, BCCCP Critical Care Clinical Pharmacist 01/12/2024 1:28 PM

## 2024-01-12 NOTE — Progress Notes (Signed)
 Patient was transported from the ED to room 2M04 on the ventilator with no problems.

## 2024-01-12 NOTE — Progress Notes (Signed)
 STAT LTM EEG hooked up and running - no initial skin breakdown. MRI safe leads used. Pt not monitored at this time due to being in the ED. Bed assigned on 67M

## 2024-01-12 NOTE — ED Notes (Signed)
 Propofol restarted at this time while Admitting was at bedside with sedation paused monitoring pt's response.

## 2024-01-12 NOTE — ED Notes (Addendum)
 Abnormal Troponin result reported to admitting PA .

## 2024-01-12 NOTE — TOC CM/SW Note (Addendum)
 Transition of Care [facility]) - Inpatient Brief Assessment   Patient Details  Name: Thomas Stephenson MRN: [id number] Date of Birth: 08-07-1980  Transition of Care [facility]) CM/SW Contact:    Lauraine FORBES Saa, LCSWA Phone Number: 01/12/2024, 3:52 PM   Clinical Narrative:  3:52 PM Per chart review, patient has a PCP and insurance. Patient does not have SNF/HH/DME history. Patient's preferred pharmacy is [facility]. Patient is currently intubated with feeding tube and unable to answer SDOH questions. No TOC needs identified at this time. TOC will continue to follow.  Transition of Care Asessment: Insurance and Status: Insurance coverage has been reviewed Patient has primary care physician: Yes Home environment has been reviewed: Private Residence Prior level of function:: N/A Prior/Current Home Services: No current home services Social Drivers of Health Review:  (Patient unable to answer) Readmission risk has been reviewed: Yes (Currently Green 13%) Transition of care needs: no transition of care needs at this time

## 2024-01-12 NOTE — Progress Notes (Signed)
 Rounding Note   Patient Name: Thomas Stephenson Date of Encounter: 01/12/2024  [facility] Cardiologist: None   Subjective  Pt intubated and sedated  Scheduled Meds:  Chlorhexidine Gluconate Cloth  6 each Topical Daily   famotidine  20 mg Per Tube BID   insulin  aspart  0-15 Units Subcutaneous Q4H   mouth rinse  15 mL Mouth Rinse Q2H   phosphorus  500 mg Per Tube Q4H   Continuous Infusions:  sodium chloride     epinephrine Stopped (01/11/24 2151)   fentaNYL infusion INTRAVENOUS Stopped (01/12/24 0927)   heparin 900 Units/hr (01/12/24 0834)   lactated ringers 125 mL/hr at 01/12/24 0834   magnesium sulfate bolus IVPB     propofol (DIPRIVAN) infusion 70 mcg/kg/min (01/12/24 0834)   PRN Meds: acetaminophen (TYLENOL) oral liquid 160 mg/5 mL, docusate, etomidate, fentaNYL, mouth rinse, polyethylene glycol, rocuronium   Vital Signs  Vitals:   01/12/24 0715 01/12/24 0749 01/12/24 0800 01/12/24 0845  BP: 117/70  112/68 107/64  Pulse: 94  94 93  Resp: 19  15 18   Temp: (!) 100.8 F (38.2 C)  (!) 100.9 F (38.3 C) (!) 100.9 F (38.3 C)  SpO2: 100% 100% 100% 100%  Weight:      Height:        Intake/Output Summary (Last 24 hours) at 01/12/2024 0932 Last data filed at 01/12/2024 0834 Gross per 24 hour  Intake 1263.79 ml  Output 1100 ml  Net 163.79 ml      01/12/2024    3:12 AM 11/04/2023    1:55 PM 06/09/2023    7:40 AM  Last 3 Weights  Weight (lbs) 160 lb 7.9 oz 160 lb 9.6 oz 152 lb 14.4 oz  Weight (kg) 72.8 kg 72.848 kg 69.355 kg      Telemetry Sinus - Personally Reviewed  ECG  Sinus, subtle diffuse ST depression - Personally Reviewed  Physical Exam  GEN: Intubated, sedated.  Neck: No JVD Cardiac: RRR, no murmurs, rubs, or gallops.  Respiratory: Clear to auscultation bilaterally. GI: Soft MS: No LE edema; No deformity. Psych: sedated  Labs High Sensitivity Troponin:   Recent Labs  Lab 01/11/24 1951 01/11/24 2330  TROPONINIHS 105* 1,126*      Chemistry Recent Labs  Lab 01/11/24 1951 01/11/24 1953 01/11/24 2103 01/11/24 2330 01/12/24 0251  NA 135  137 137 138  --  136  K 3.8  3.9 3.9 3.2*  --  4.6  CL 101  104  --   --   --  103  CO2 15*  --   --   --  20*  GLUCOSE 340*  348*  --   --   --  328*  BUN 16  19  --   --   --  20  CREATININE 1.45*  1.20  --   --  1.34* 1.16  CALCIUM 8.5*  --   --   --  8.3*  MG 2.2  --   --   --  1.7  PROT 6.0*  --   --   --   --   ALBUMIN 3.7  --   --   --   --   AST 237*  --   --   --   --   ALT 332*  --   --   --   --   ALKPHOS 53  --   --   --   --   BILITOT 0.9  --   --   --   --  GFRNONAA >60  --   --  >60 >60  ANIONGAP 19*  --   --   --  13    Lipids  Recent Labs  Lab 01/11/24 1951 01/12/24 0251  CHOL 160  --   TRIG 82 87  HDL 28*  --   LDLCALC 116*  --   CHOLHDL 5.7  --     Hematology Recent Labs  Lab 01/11/24 1951 01/11/24 1953 01/11/24 2103 01/12/24 0251  WBC 10.5  --   --  16.7*  RBC 5.17  --   --  5.03  HGB 15.3  15.6 15.3 15.6 15.1  HCT 45.3  46.0 45.0 46.0 43.1  MCV 87.6  --   --  85.7  MCH 29.6  --   --  30.0  MCHC 33.8  --   --  35.0  RDW 11.9  --   --  11.9  PLT 239  --   --  232   Thyroid  No results for input(s): TSH, FREET4 in the last 168 hours.  BNP Recent Labs  Lab 01/11/24 1951  BNP 20.9    DDimer No results for input(s): DDIMER in the last 168 hours.   Radiology  ECHOCARDIOGRAM COMPLETE Result Date: 01/12/2024    ECHOCARDIOGRAM REPORT   Patient Name:   Axell Trigueros Date of Exam: 01/12/2024 Medical Rec #:  [id number]   Height:       69.0 in Accession #:    [id number]  Weight:       160.5 lb Date of Birth:  01/05/1981   BSA:          1.882 m Patient Age:    43 years    BP:           114/72 mmHg Patient Gender: M           HR:           95 bpm. Exam Location:  Inpatient Procedure: 2D Echo (Both Spectral and Color Flow Doppler were utilized during            procedure). Indications:    Cardiac Arrest  History:        Patient  has no prior history of Echocardiogram examinations.                 Signs/Symptoms:Cardiac Arrest.  Sonographer:    Norleen Amour Referring Phys: JESSICA MARSHALL IMPRESSIONS  1. Left ventricular ejection fraction, by estimation, is 55 to 60%. The left ventricle has normal function. The left ventricle has no regional wall motion abnormalities. Left ventricular diastolic parameters were normal.  2. Right ventricular systolic function is normal. The right ventricular size is normal.  3. The mitral valve is normal in structure. No evidence of mitral valve regurgitation. No evidence of mitral stenosis.  4. The aortic valve was not well visualized. Aortic valve regurgitation is not visualized.  5. The inferior vena cava is normal in size with greater than 50% respiratory variability, suggesting right atrial pressure of 3 mmHg. Comparison(s): No prior Echocardiogram. FINDINGS  Left Ventricle: Left ventricular ejection fraction, by estimation, is 55 to 60%. The left ventricle has normal function. The left ventricle has no regional wall motion abnormalities. The left ventricular internal cavity size was normal in size. There is  no left ventricular hypertrophy. Left ventricular diastolic parameters were normal. Right Ventricle: The right ventricular size is normal. No increase in right ventricular wall thickness. Right ventricular systolic function is normal. Left Atrium: Left atrial size  was normal in size. Right Atrium: Right atrial size was normal in size. Pericardium: There is no evidence of pericardial effusion. Mitral Valve: The mitral valve is normal in structure. No evidence of mitral valve regurgitation. No evidence of mitral valve stenosis. Tricuspid Valve: The tricuspid valve is not well visualized. Tricuspid valve regurgitation is not demonstrated. No evidence of tricuspid stenosis. Aortic Valve: The aortic valve was not well visualized. Aortic valve regurgitation is not visualized. Aortic valve mean gradient  measures 5.0 mmHg. Aortic valve peak gradient measures 7.5 mmHg. Aortic valve area, by VTI measures 1.94 cm. Pulmonic Valve: The pulmonic valve was normal in structure. Pulmonic valve regurgitation is not visualized. No evidence of pulmonic stenosis. Aorta: The aortic root and ascending aorta are structurally normal, with no evidence of dilitation. Venous: The inferior vena cava is normal in size with greater than 50% respiratory variability, suggesting right atrial pressure of 3 mmHg. IAS/Shunts: The atrial septum is grossly normal.  LEFT VENTRICLE PLAX 2D LVIDd:         4.00 cm     Diastology LVIDs:         2.90 cm     LV e' medial:    7.83 cm/s LV PW:         0.80 cm     LV E/e' medial:  8.1 LV IVS:        0.60 cm     LV e' lateral:   9.79 cm/s LVOT diam:     1.80 cm     LV E/e' lateral: 6.5 LV SV:         41 LV SV Index:   22 LVOT Area:     2.54 cm  LV Volumes (MOD) LV vol d, MOD A2C: 38.6 ml LV vol d, MOD A4C: 38.8 ml LV vol s, MOD A2C: 15.7 ml LV vol s, MOD A4C: 17.9 ml LV SV MOD A2C:     22.9 ml LV SV MOD A4C:     38.8 ml LV SV MOD BP:      22.1 ml RIGHT VENTRICLE             IVC RV Basal diam:  2.70 cm     IVC diam: 1.70 cm RV S prime:     12.20 cm/s TAPSE (M-mode): 1.9 cm      PULMONARY VEINS                             Diastolic Velocity: 31.80 cm/s                             S/D Velocity:       1.20                             Systolic Velocity:  37.50 cm/s LEFT ATRIUM           Index        RIGHT ATRIUM          Index LA diam:      2.50 cm 1.33 cm/m   RA Area:     8.74 cm LA Vol (A2C): 10.1 ml 5.37 ml/m   RA Volume:   18.70 ml 9.94 ml/m LA Vol (A4C): 20.1 ml 10.68 ml/m  AORTIC VALVE  PULMONIC VALVE AV Area (Vmax):    1.81 cm      PV Vmax:       1.15 m/s AV Area (Vmean):   1.63 cm      PV Peak grad:  5.3 mmHg AV Area (VTI):     1.94 cm AV Vmax:           137.00 cm/s AV Vmean:          100.000 cm/s AV VTI:            0.211 m AV Peak Grad:      7.5 mmHg AV Mean Grad:      5.0  mmHg LVOT Vmax:         97.40 cm/s LVOT Vmean:        63.900 cm/s LVOT VTI:          0.161 m LVOT/AV VTI ratio: 0.76  AORTA Ao Root diam: 2.50 cm Ao Asc diam:  3.50 cm MITRAL VALVE MV Area (PHT): 4.26 cm    SHUNTS MV Decel Time: 178 msec    Systemic VTI:  0.16 m MV E velocity: 63.40 cm/s  Systemic Diam: 1.80 cm MV A velocity: 76.50 cm/s MV E/A ratio:  0.83 Stanly Leavens MD Electronically signed by Stanly Leavens MD Signature Date/Time: 01/12/2024/9:31:46 AM    Final    CT Head Wo Contrast Result Date: 01/11/2024 CLINICAL DATA:  Unresponsive EXAM: CT HEAD WITHOUT CONTRAST CT CERVICAL SPINE WITHOUT CONTRAST TECHNIQUE: Multidetector CT imaging of the head and cervical spine was performed following the standard protocol without intravenous contrast. Multiplanar CT image reconstructions of the cervical spine were also generated. RADIATION DOSE REDUCTION: This exam was performed according to the departmental dose-optimization program which includes automated exposure control, adjustment of the mA and/or kV according to patient size and/or use of iterative reconstruction technique. COMPARISON:  None Available. FINDINGS: CT HEAD FINDINGS Brain: No acute territorial infarction, hemorrhage, or intracranial mass. The ventricles are nonenlarged. Vascular: No hyperdense vessels.  No unexpected calcification Skull: Normal. Negative for fracture or focal lesion. Sinuses/Orbits: Mild mucosal thickening in the sinuses. Fluid and or debris in the posterior nasopharynx Other: None CT CERVICAL SPINE FINDINGS Alignment: No subluxation.  Facet alignment within normal limits Skull base and vertebrae: Slightly limited by motion degradation. No obvious fracture Soft tissues and spinal canal: Patent spinal canal. Fluid within the nasal and oropharynx. Disc levels: Relatively patent disc spaces. Minimal narrowing at C5-C6. No high-grade bony canal stenosis. Upper chest: Negative. Other: None IMPRESSION: 1. Negative non  contrasted CT appearance of the brain. 2. Cervical CT slightly limited by motion degradation. No definite acute osseous abnormality of the cervical spine. Electronically Signed   By: Luke Bun M.D.   On: 01/11/2024 22:29   CT Cervical Spine Wo Contrast Result Date: 01/11/2024 CLINICAL DATA:  Unresponsive EXAM: CT HEAD WITHOUT CONTRAST CT CERVICAL SPINE WITHOUT CONTRAST TECHNIQUE: Multidetector CT imaging of the head and cervical spine was performed following the standard protocol without intravenous contrast. Multiplanar CT image reconstructions of the cervical spine were also generated. RADIATION DOSE REDUCTION: This exam was performed according to the departmental dose-optimization program which includes automated exposure control, adjustment of the mA and/or kV according to patient size and/or use of iterative reconstruction technique. COMPARISON:  None Available. FINDINGS: CT HEAD FINDINGS Brain: No acute territorial infarction, hemorrhage, or intracranial mass. The ventricles are nonenlarged. Vascular: No hyperdense vessels.  No unexpected calcification Skull: Normal. Negative for fracture or focal lesion. Sinuses/Orbits: Mild mucosal  thickening in the sinuses. Fluid and or debris in the posterior nasopharynx Other: None CT CERVICAL SPINE FINDINGS Alignment: No subluxation.  Facet alignment within normal limits Skull base and vertebrae: Slightly limited by motion degradation. No obvious fracture Soft tissues and spinal canal: Patent spinal canal. Fluid within the nasal and oropharynx. Disc levels: Relatively patent disc spaces. Minimal narrowing at C5-C6. No high-grade bony canal stenosis. Upper chest: Negative. Other: None IMPRESSION: 1. Negative non contrasted CT appearance of the brain. 2. Cervical CT slightly limited by motion degradation. No definite acute osseous abnormality of the cervical spine. Electronically Signed   By: Luke Bun M.D.   On: 01/11/2024 22:29   DG Abdomen 1 View Result  Date: 01/11/2024 CLINICAL DATA:  OG tube placement EXAM: ABDOMEN - 1 VIEW COMPARISON:  None Available. FINDINGS: Enteric tube tip and side port overlie the stomach. Moderate air distension of the stomach. IMPRESSION: Enteric tube tip and side port overlie the stomach. Electronically Signed   By: Luke Bun M.D.   On: 01/11/2024 20:33   DG Chest Portable 1 View Result Date: 01/11/2024 CLINICAL DATA:  Found down at work EXAM: PORTABLE CHEST 1 VIEW COMPARISON:  None Available. FINDINGS: Endotracheal tube tip about 5.1 cm superior to the carina. Enteric tube tip below the diaphragm but incompletely assessed. No acute airspace disease or effusion. Normal cardiac size. No pneumothorax IMPRESSION: Endotracheal tube tip about 5.1 cm superior to the carina. No acute airspace disease. Electronically Signed   By: Luke Bun M.D.   On: 01/11/2024 20:33    Patient Profile    43 y.o. male with history of HTN, HLD, DM who was admitted post cardiac arrest on the evening of 01/11/24. He collapsed at work but unknown downtime. He was in asystole when EMS arrived and ROSC achieved after 18 minutes of CPR. Case discussed with on call interventional cardiology and no plans for urgent cardiac catheterization. Echo today with normal LV function without wall motion abnormality. No valve disease.   Assessment & Plan   Cardiac arrest: Pt remains unresponsive. EKG with diffuse ST depression but no clear injury pattern. Echo with normal LV function and no wall motion abnormality.  BP stable  If he has evidence of neurological recovery, can consider ischemic workup.  For now supportive care per PCCM    For questions or updates, please contact [facility] Please consult [email or web address] for contact info under   Signed, Lonni Cash, MD  01/12/2024, 9:32 AM

## 2024-01-12 NOTE — Progress Notes (Signed)
 NAME:  Thomas Stephenson, MRN:  [id number], DOB:  28-Dec-1980, LOS: 1 ADMISSION DATE:  01/11/2024, CONSULTATION DATE:  01/11/24 REFERRING MD:  EDP, CHIEF COMPLAINT:  cardiac arrest   History of Present Illness:  43 yo male presented after sudden collapse, cardiac arrest and bystander initiated cpr. Pt has unknown overall down time, when EMS arrived they performed acls x69mins with rosc. All history is obtained from chart review as pt is intubated and unresponsive.  He is currently on 4mcg epi. CTH/neck pending. EKG with rbbb (no old comparisons) cardiology consulted and will see, no STMI activation but would like heparin infusion should cth result allow.   Pertinent  Medical History  dm2  Significant Hospital Events: Including procedures, antibiotic start and stop dates in addition to other pertinent events   Admitted to ICU 10/27  Interim History / Subjective:   Critically ill, sedated on propofol and fentanyl Febrile 100.9. 1.1 L urine output Wife Brandy at bedside, provides more history, was apparently well when he went to work  Objective    Blood pressure 107/64, pulse 93, temperature (!) 100.9 F (38.3 C), resp. rate 18, height 5' 9 (1.753 m), weight 72.8 kg, SpO2 100%.    Vent Mode: PRVC FiO2 (%):  [30 %-40 %] 30 % Set Rate:  [15 bmp] 15 bmp Vt Set:  [570 mL] 570 mL PEEP:  [5 cmH20] 5 cmH20 Plateau Pressure:  [15 cmH20] 15 cmH20   Intake/Output Summary (Last 24 hours) at 01/12/2024 9047 Last data filed at 01/12/2024 9165 Gross per 24 hour  Intake 1263.79 ml  Output 1100 ml  Net 163.79 ml   Filed Weights   01/12/24 9687  Weight: 72.8 kg    Examination: General: unresponsive on vent HENT: ncat perrla but sluggish, mmmp Lungs: Bilateral ventilated breath sounds Cardiovascular: rrr Abdomen: soft, nt, nd bs + Extremities: no c/c/e Neuro: GCS 3, when sedation turned off, mild myoclonic jerks noted with upward gaze >> propofol restarted GU: deferred  Labs show  hyperglycemia, normal electrolytes except for hypophosphatemia, leukocytosis, elevated lactate UDS positive for benzos  Resolved problem list   Assessment and Plan  Oohca s/p rosc Acute coronary syndrome -echo shows normal LV function , initial ST elevation V1 V2 which is subsided, elevated troponin Lactic acidosis  - Trend troponin Cardiology following, ischemic workup if he has neurorecovery - Off epinephrine, can resume Levophed if does not reach goal MAP   Anoxic encephalopathy Myoclonic jerks  -Stat EEG followed by LTM EEG - Initiate hypothermia protocol with Tylenol, Arctic sun pads - Buspirone for shivering -Deep sedation with propofol, changed to intermittent fentanyl, goal RASS -3 to -1 - Neuro consulted, defer anticonvulsants and repeat imaging to them  Acute hypoxic resp failure req mechanical ventilation  -Not ready to wean due to above Transaminitis  - Check LFTs intermittently  Aki, nonoliguric - Follow electrolytes, replace mag and Phos  Hyperglycemia with t2dm - SSI  Wife Brandy updated to plan of care, full code as of now. Guarded prognosis given for neurologic recovery due to myoclonic activity   Labs   CBC: Recent Labs  Lab 01/11/24 1951 01/11/24 1953 01/11/24 2103 01/12/24 0251  WBC 10.5  --   --  16.7*  NEUTROABS 7.1  --   --   --   HGB 15.3  15.6 15.3 15.6 15.1  HCT 45.3  46.0 45.0 46.0 43.1  MCV 87.6  --   --  85.7  PLT 239  --   --  232  Basic Metabolic Panel: Recent Labs  Lab 01/11/24 1951 01/11/24 1953 01/11/24 2103 01/11/24 2330 01/12/24 0251  NA 135  137 137 138  --  136  K 3.8  3.9 3.9 3.2*  --  4.6  CL 101  104  --   --   --  103  CO2 15*  --   --   --  20*  GLUCOSE 340*  348*  --   --   --  328*  BUN 16  19  --   --   --  20  CREATININE 1.45*  1.20  --   --  1.34* 1.16  CALCIUM 8.5*  --   --   --  8.3*  MG 2.2  --   --   --  1.7  PHOS  --   --   --   --  2.3*   GFR: Estimated Creatinine Clearance:  80.4 mL/min (by C-G formula based on SCr of 1.16 mg/dL). Recent Labs  Lab 01/11/24 1951 01/11/24 2340 01/12/24 0202 01/12/24 0251  WBC 10.5  --   --  16.7*  LATICACIDVEN  --  6.2* 6.0*  --     Liver Function Tests: Recent Labs  Lab 01/11/24 1951  AST 237*  ALT 332*  ALKPHOS 53  BILITOT 0.9  PROT 6.0*  ALBUMIN 3.7   No results for input(s): LIPASE, AMYLASE in the last 168 hours. No results for input(s): AMMONIA in the last 168 hours.  ABG    Component Value Date/Time   PHART 7.261 (L) 01/11/2024 2103   PCO2ART 36.1 01/11/2024 2103   PO2ART 599 (H) 01/11/2024 2103   HCO3 16.6 (L) 01/11/2024 2103   TCO2 18 (L) 01/11/2024 2103   ACIDBASEDEF 10.0 (H) 01/11/2024 2103   O2SAT 100 01/11/2024 2103     Coagulation Profile: Recent Labs  Lab 01/11/24 1951  INR 1.1    Cardiac Enzymes: No results for input(s): CKTOTAL, CKMB, CKMBINDEX, TROPONINI in the last 168 hours.  HbA1C: Hemoglobin A1C  Date/Time Value Ref Range Status  06/09/2023 07:45 AM 5.6 4.0 - 5.6 % Final   Hgb A1c MFr Bld  Date/Time Value Ref Range Status  11/04/2023 02:24 PM 6.2 (H) <5.7 % Final    Comment:    For someone without known diabetes, a hemoglobin  A1c value between 5.7% and 6.4% is consistent with prediabetes and should be confirmed with a  follow-up test. . For someone with known diabetes, a value <7% indicates that their diabetes is well controlled. A1c targets should be individualized based on duration of diabetes, age, comorbid conditions, and other considerations. . This assay result is consistent with an increased risk of diabetes. . Currently, no consensus exists regarding use of hemoglobin A1c for diagnosis of diabetes for children. .   02/02/2023 11:03 AM 9.6 (H) <5.7 % of total Hgb Final    Comment:    For someone without known diabetes, a hemoglobin A1c value of 6.5% or greater indicates that they may have  diabetes and this should be confirmed with a  follow-up  test. . For someone with known diabetes, a value <7% indicates  that their diabetes is well controlled and a value  greater than or equal to 7% indicates suboptimal  control. A1c targets should be individualized based on  duration of diabetes, age, comorbid conditions, and  other considerations. . Currently, no consensus exists regarding use of hemoglobin A1c for diagnosis of diabetes for children. .     CBG:  Recent Labs  Lab 01/11/24 1945 01/12/24 0003 01/12/24 0407 01/12/24 0809  GLUCAP 298* 396* 265* 169*    Critical care time: 42 min      Harden Staff MD. FCCP. [facility] Pager : [phone]  If no response to pager , please call [phone] until 7 pm After 7:00 pm call Elink  [phone]   01/12/2024

## 2024-01-12 NOTE — ED Notes (Signed)
MD notified of elevated temperature.

## 2024-01-12 NOTE — Progress Notes (Addendum)
 eLink Physician-Brief Progress Note Patient Name: Thomas Stephenson DOB: 27-Feb-1981 MRN: [id number]   Date of Service  01/12/2024  HPI/Events of Note  EKG with inferior ST segment depression suggestive of sub-endocardial ischemia, patient also has elevated blood pressure (170's) and is not able to indicate symptoms due to intubated status. He is s/p cardiac arrest with CPR.  eICU Interventions  Trend Troponin, Aspirin, PRN iv Lopressor , 1/2 inch Nitropaste, Echocardiogram. Continue Heparin gtt.        Thomas Stephenson Thomas Stephenson 01/12/2024, 10:18 PM

## 2024-01-12 NOTE — Progress Notes (Addendum)
 PHARMACY - ANTICOAGULATION CONSULT NOTE  Pharmacy Consult for heparin Indication: chest pain/ACS  Allergies  Allergen Reactions   Mushroom Extract Complex (Obsolete)     Unknown reaction    Patient Measurements: Height: 5' 9 (175.3 cm) Weight: 72.8 kg (160 lb 7.9 oz) (Wt from 11/04/2023) IBW/kg (Calculated) : 70.7 HEPARIN DW (KG): 72.8  Vital Signs: Temp: 100.2 F (37.9 C) (10/28 0305) BP: 119/74 (10/28 0300) Pulse Rate: 96 (10/28 0305)  Labs: Recent Labs    01/11/24 1951 01/11/24 1953 01/11/24 2103 01/11/24 2330 01/12/24 0251  HGB 15.3  15.6 15.3 15.6  --  15.1  HCT 45.3  46.0 45.0 46.0  --  43.1  PLT 239  --   --   --  232  APTT 28  --   --   --   --   LABPROT 14.5  --   --   --   --   INR 1.1  --   --   --   --   CREATININE 1.45*  1.20  --   --  1.34* 1.16  TROPONINIHS 105*  --   --  1,126*  --     Estimated Creatinine Clearance: 80.4 mL/min (by C-G formula based on SCr of 1.16 mg/dL).   Medical History: Past Medical History:  Diagnosis Date   Diabetes mellitus without complication (HCC)    Hypertension    Assessment: 58 yoM presented after collapsed and OOH cardiac arrest with downtime unknown now s/p ROSC. Pharmacy consulted to dose heparin for ACS.  -CBC WNL -No PTA oral anticoagulation -Trop 1100; ECG sinus w/ RBBB -Received 5000 units of subcutaneous heparin ~0000  Goal of Therapy:  Heparin level 0.3-0.7 units/ml Monitor platelets by anticoagulation protocol: Yes   Plan:  No bolus given subcutaneous heparin administration  Start heparin infusion at 900 units/hr Check anti-Xa level in 6 hours and daily while on heparin Continue to monitor H&H and platelets  Lynwood Poplar, PharmD, BCPS Clinical Pharmacist 01/12/2024 3:38 AM

## 2024-01-12 NOTE — ED Notes (Signed)
 Artic sun applied

## 2024-01-12 NOTE — ED Notes (Signed)
Admitting MD paged by secretary for RN to update on elevated Troponin result.

## 2024-01-13 ENCOUNTER — Inpatient Hospital Stay (HOSPITAL_COMMUNITY)

## 2024-01-13 DIAGNOSIS — R569 Unspecified convulsions: Secondary | ICD-10-CM | POA: Diagnosis not present

## 2024-01-13 DIAGNOSIS — Z9911 Dependence on respirator [ventilator] status: Secondary | ICD-10-CM

## 2024-01-13 DIAGNOSIS — I469 Cardiac arrest, cause unspecified: Secondary | ICD-10-CM | POA: Diagnosis not present

## 2024-01-13 DIAGNOSIS — J9601 Acute respiratory failure with hypoxia: Secondary | ICD-10-CM | POA: Diagnosis not present

## 2024-01-13 DIAGNOSIS — E1165 Type 2 diabetes mellitus with hyperglycemia: Secondary | ICD-10-CM | POA: Diagnosis not present

## 2024-01-13 DIAGNOSIS — G40409 Other generalized epilepsy and epileptic syndromes, not intractable, without status epilepticus: Secondary | ICD-10-CM | POA: Diagnosis not present

## 2024-01-13 DIAGNOSIS — I4581 Long QT syndrome: Secondary | ICD-10-CM

## 2024-01-13 DIAGNOSIS — G931 Anoxic brain damage, not elsewhere classified: Secondary | ICD-10-CM | POA: Diagnosis not present

## 2024-01-13 DIAGNOSIS — L8990 Pressure ulcer of unspecified site, unspecified stage: Secondary | ICD-10-CM | POA: Insufficient documentation

## 2024-01-13 LAB — GLUCOSE, CAPILLARY
Glucose-Capillary: 149 mg/dL — ABNORMAL HIGH (ref 70–99)
Glucose-Capillary: 142 mg/dL — ABNORMAL HIGH (ref 70–99)
Glucose-Capillary: 166 mg/dL — ABNORMAL HIGH (ref 70–99)
Glucose-Capillary: 190 mg/dL — ABNORMAL HIGH (ref 70–99)
Glucose-Capillary: 267 mg/dL — ABNORMAL HIGH (ref 70–99)
Glucose-Capillary: 244 mg/dL — ABNORMAL HIGH (ref 70–99)

## 2024-01-13 LAB — BASIC METABOLIC PANEL WITH GFR
Anion gap: 13 (ref 5–15)
Anion gap: 7 (ref 5–15)
BUN: 12 mg/dL (ref 6–20)
BUN: 13 mg/dL (ref 6–20)
CO2: 21 mmol/L — ABNORMAL LOW (ref 22–32)
CO2: 22 mmol/L (ref 22–32)
Calcium: 8.1 mg/dL — ABNORMAL LOW (ref 8.9–10.3)
Calcium: 8.3 mg/dL — ABNORMAL LOW (ref 8.9–10.3)
Chloride: 105 mmol/L (ref 98–111)
Chloride: 111 mmol/L (ref 98–111)
Creatinine, Ser: 0.79 mg/dL (ref 0.61–1.24)
Creatinine, Ser: 0.83 mg/dL (ref 0.61–1.24)
GFR, Estimated: >60 mL/min (ref >60)
GFR, Estimated: >60 mL/min (ref >60)
Glucose, Bld: 154 mg/dL — ABNORMAL HIGH (ref 70–99)
Glucose, Bld: 194 mg/dL — ABNORMAL HIGH (ref 70–99)
Potassium: 3.6 mmol/L (ref 3.5–5.1)
Potassium: 3.9 mmol/L (ref 3.5–5.1)
Sodium: 139 mmol/L (ref 135–145)
Sodium: 140 mmol/L (ref 135–145)

## 2024-01-13 LAB — HEPARIN LEVEL (UNFRACTIONATED): Heparin Unfractionated: 0.37 [IU]/mL (ref 0.30–0.70)

## 2024-01-13 LAB — CBC
HCT: 45.1 % (ref 39.0–52.0)
Hemoglobin: 15.3 g/dL (ref 13.0–17.0)
MCH: 29.1 pg (ref 26.0–34.0)
MCHC: 33.9 g/dL (ref 30.0–36.0)
MCV: 85.9 fL (ref 80.0–100.0)
Platelets: 170 K/uL (ref 150–400)
RBC: 5.25 MIL/uL (ref 4.22–5.81)
RDW: 12.2 % (ref 11.5–15.5)
WBC: 14.3 K/uL — ABNORMAL HIGH (ref 4.0–10.5)
nRBC: 0 % (ref 0.0–0.2)

## 2024-01-13 LAB — LACTIC ACID, PLASMA: Lactic Acid, Venous: 2.5 mmol/L (ref 0.5–1.9)

## 2024-01-13 LAB — MAGNESIUM
Magnesium: 2.4 mg/dL (ref 1.7–2.4)
Magnesium: 2.5 mg/dL — ABNORMAL HIGH (ref 1.7–2.4)

## 2024-01-13 LAB — PHOSPHORUS
Phosphorus: 2.7 mg/dL (ref 2.5–4.6)
Phosphorus: 3 mg/dL (ref 2.5–4.6)

## 2024-01-13 LAB — TROPONIN I (HIGH SENSITIVITY)
Troponin I (High Sensitivity): 1078 ng/L (ref <18)
Troponin I (High Sensitivity): 1337 ng/L (ref <18)

## 2024-01-13 LAB — TRIGLYCERIDES: Triglycerides: 119 mg/dL (ref <150)

## 2024-01-13 MED ORDER — VITAL 1.5 CAL PO LIQD
1000.0000 mL | ORAL | Status: DC
  Administered 2024-01-15 (×2): 1000 mL

## 2024-01-13 MED ORDER — INSULIN ASPART 100 UNIT/ML IJ SOLN
2.0000 [IU] | INTRAMUSCULAR | Status: DC
  Administered 2024-01-13 – 2024-01-14 (×4): 2 [IU] via SUBCUTANEOUS

## 2024-01-13 MED ORDER — VITAL 1.5 CAL PO LIQD
1000.0000 mL | ORAL | Status: DC
  Administered 2024-01-13: 1000 mL
  Filled 2024-01-13: qty 1000

## 2024-01-13 MED ORDER — PANTOPRAZOLE SODIUM 40 MG IV SOLR
40.0000 mg | INTRAVENOUS | Status: DC
  Administered 2024-01-13 – 2024-01-15 (×3): 40 mg via INTRAVENOUS
  Filled 2024-01-13 (×3): qty 10

## 2024-01-13 MED ORDER — PROSOURCE TF20 ENFIT COMPATIBL EN LIQD
60.0000 mL | Freq: Every day | ENTERAL | Status: DC
  Administered 2024-01-13 – 2024-01-15 (×3): 60 mL
  Filled 2024-01-13 (×3): qty 60

## 2024-01-13 MED ORDER — ATORVASTATIN CALCIUM 40 MG PO TABS
40.0000 mg | ORAL_TABLET | Freq: Every day | ORAL | Status: DC
  Administered 2024-01-13 – 2024-01-15 (×3): 40 mg
  Filled 2024-01-13 (×3): qty 1

## 2024-01-13 MED ORDER — VITAL HP 1.0 CAL PO LIQD: 1000.0000 mL | ORAL | Status: DC

## 2024-01-13 MED ORDER — CALCIUM GLUCONATE-NACL 2-0.675 GM/100ML-% IV SOLN
2.0000 g | Freq: Once | INTRAVENOUS | Status: AC
  Administered 2024-01-13: 2000 mg via INTRAVENOUS
  Filled 2024-01-13: qty 100

## 2024-01-13 NOTE — Progress Notes (Signed)
 Rounding Note   Patient Name: Thomas Stephenson Date of Encounter: 01/13/2024  [facility] Cardiologist: None   Subjective  Pt intubated and sedated.   Scheduled Meds:  acetaminophen  650 mg Per Tube Q4H   Or   acetaminophen (TYLENOL) oral liquid 160 mg/5 mL  650 mg Per Tube Q4H   Or   acetaminophen  650 mg Rectal Q4H   aspirin  81 mg Per Tube Daily   busPIRone  30 mg Per Tube Q8H   Or   busPIRone  30 mg Per Tube Q8H   Chlorhexidine Gluconate Cloth  6 each Topical Daily   famotidine  20 mg Per Tube BID   insulin  aspart  0-15 Units Subcutaneous Q4H   levETIRAcetam  500 mg Intravenous Q12H   nitroGLYCERIN  0.5 inch Topical Q6H   mouth rinse  15 mL Mouth Rinse Q2H   Continuous Infusions:  sodium chloride 20 mL/hr at 01/13/24 0700   ampicillin-sulbactam (UNASYN) IV Stopped (01/13/24 9372)   heparin 1,000 Units/hr (01/13/24 0700)   norepinephrine (LEVOPHED) Adult infusion Stopped (01/12/24 1025)   propofol (DIPRIVAN) infusion 60 mcg/kg/min (01/13/24 0558)   valproate sodium 500 mg (01/13/24 0931)   PRN Meds: acetaminophen (TYLENOL) oral liquid 160 mg/5 mL, docusate, fentaNYL (SUBLIMAZE) injection, metoprolol tartrate, midazolam PF, midazolam PF, mouth rinse, polyethylene glycol   Vital Signs  Vitals:   01/13/24 0500 01/13/24 0600 01/13/24 0700 01/13/24 0748  BP: (!) 160/93 (!) 157/90 (!) 151/87   Pulse: 94 92 94 97  Resp: 15 15 15 15   Temp: 98.2 F (36.8 C) 98.2 F (36.8 C) 98.2 F (36.8 C) 98.4 F (36.9 C)  TempSrc:      SpO2: 99% 99% 99% 99%  Weight: 76.3 kg     Height:        Intake/Output Summary (Last 24 hours) at 01/13/2024 0948 Last data filed at 01/13/2024 0700 Gross per 24 hour  Intake 1393.08 ml  Output 1100 ml  Net 293.08 ml      01/13/2024    5:00 AM 01/12/2024    3:12 AM 11/04/2023    1:55 PM  Last 3 Weights  Weight (lbs) 168 lb 3.4 oz 160 lb 7.9 oz 160 lb 9.6 oz  Weight (kg) 76.3 kg 72.8 kg 72.848 kg      Telemetry Sinus -  Personally Reviewed  ECG  No AM tracing - Personally Reviewed  Physical Exam  GEN: Sedated, intubated Neck: No JVD Cardiac: RRR, no murmurs, rubs, or gallops.  Respiratory: Clear to auscultation bilaterally. GI: Soft MS: No LE edema Psych: sedated  Labs High Sensitivity Troponin:   Recent Labs  Lab 01/11/24 1951 01/11/24 2330 01/12/24 2332 01/13/24 0323  TROPONINIHS 105* 1,126* 1,078* 1,337*     Chemistry Recent Labs  Lab 01/11/24 1951 01/11/24 1953 01/11/24 2103 01/11/24 2330 01/12/24 0251 01/12/24 2332  NA 135  137   < > 138  --  136 139  K 3.8  3.9   < > 3.2*  --  4.6 3.9  CL 101  104  --   --   --  103 105  CO2 15*  --   --   --  20* 21*  GLUCOSE 340*  348*  --   --   --  328* 194*  BUN 16  19  --   --   --  20 13  CREATININE 1.45*  1.20  --   --  1.34* 1.16 0.83  CALCIUM 8.5*  --   --   --  8.3* 8.3*  MG 2.2  --   --   --  1.7 2.5*  PROT 6.0*  --   --   --   --   --   ALBUMIN 3.7  --   --   --   --   --   AST 237*  --   --   --   --   --   ALT 332*  --   --   --   --   --   ALKPHOS 53  --   --   --   --   --   BILITOT 0.9  --   --   --   --   --   GFRNONAA >60  --   --  >60 >60 >60  ANIONGAP 19*  --   --   --  13 13   < > = values in this interval not displayed.    Lipids  Recent Labs  Lab 01/11/24 1951 01/12/24 0251 01/13/24 0323  CHOL 160  --   --   TRIG 82   < > 119  HDL 28*  --   --   LDLCALC 116*  --   --   CHOLHDL 5.7  --   --    < > = values in this interval not displayed.    Hematology Recent Labs  Lab 01/12/24 0251 01/12/24 1105 01/12/24 2349  WBC 16.7* 13.8* 14.3*  RBC 5.03 4.60 5.25  HGB 15.1 13.4 15.3  HCT 43.1 40.3 45.1  MCV 85.7 87.6 85.9  MCH 30.0 29.1 29.1  MCHC 35.0 33.3 33.9  RDW 11.9 11.9 12.2  PLT 232 186 170   Thyroid  No results for input(s): TSH, FREET4 in the last 168 hours.  BNP Recent Labs  Lab 01/11/24 1951  BNP 20.9    DDimer No results for input(s): DDIMER in the last 168 hours.    Radiology  ECHOCARDIOGRAM COMPLETE Result Date: 01/12/2024    ECHOCARDIOGRAM REPORT   Patient Name:   Thomas Stephenson Date of Exam: 01/12/2024 Medical Rec #:  [id number]   Height:       69.0 in Accession #:    [id number]  Weight:       160.5 lb Date of Birth:  [birth year]   BSA:          1.882 m Patient Age:    43 years    BP:           114/72 mmHg Patient Gender: M           HR:           95 bpm. Exam Location:  Inpatient Procedure: 2D Echo (Both Spectral and Color Flow Doppler were utilized during            procedure). Indications:    Cardiac Arrest  History:        Patient has no prior history of Echocardiogram examinations.                 Signs/Symptoms:Cardiac Arrest.  Sonographer:    Norleen Amour Referring Phys: JESSICA MARSHALL IMPRESSIONS  1. Left ventricular ejection fraction, by estimation, is 55 to 60%. The left ventricle has normal function. The left ventricle has no regional wall motion abnormalities. Left ventricular diastolic parameters were normal.  2. Right ventricular systolic function is normal. The right ventricular size is normal.  3. The mitral valve is normal in structure. No evidence of mitral valve regurgitation. No  evidence of mitral stenosis.  4. The aortic valve was not well visualized. Aortic valve regurgitation is not visualized.  5. The inferior vena cava is normal in size with greater than 50% respiratory variability, suggesting right atrial pressure of 3 mmHg. Comparison(s): No prior Echocardiogram. FINDINGS  Left Ventricle: Left ventricular ejection fraction, by estimation, is 55 to 60%. The left ventricle has normal function. The left ventricle has no regional wall motion abnormalities. The left ventricular internal cavity size was normal in size. There is  no left ventricular hypertrophy. Left ventricular diastolic parameters were normal. Right Ventricle: The right ventricular size is normal. No increase in right ventricular wall thickness. Right ventricular systolic function  is normal. Left Atrium: Left atrial size was normal in size. Right Atrium: Right atrial size was normal in size. Pericardium: There is no evidence of pericardial effusion. Mitral Valve: The mitral valve is normal in structure. No evidence of mitral valve regurgitation. No evidence of mitral valve stenosis. Tricuspid Valve: The tricuspid valve is not well visualized. Tricuspid valve regurgitation is not demonstrated. No evidence of tricuspid stenosis. Aortic Valve: The aortic valve was not well visualized. Aortic valve regurgitation is not visualized. Aortic valve mean gradient measures 5.0 mmHg. Aortic valve peak gradient measures 7.5 mmHg. Aortic valve area, by VTI measures 1.94 cm. Pulmonic Valve: The pulmonic valve was normal in structure. Pulmonic valve regurgitation is not visualized. No evidence of pulmonic stenosis. Aorta: The aortic root and ascending aorta are structurally normal, with no evidence of dilitation. Venous: The inferior vena cava is normal in size with greater than 50% respiratory variability, suggesting right atrial pressure of 3 mmHg. IAS/Shunts: The atrial septum is grossly normal.  LEFT VENTRICLE PLAX 2D LVIDd:         4.00 cm     Diastology LVIDs:         2.90 cm     LV e' medial:    7.83 cm/s LV PW:         0.80 cm     LV E/e' medial:  8.1 LV IVS:        0.60 cm     LV e' lateral:   9.79 cm/s LVOT diam:     1.80 cm     LV E/e' lateral: 6.5 LV SV:         41 LV SV Index:   22 LVOT Area:     2.54 cm  LV Volumes (MOD) LV vol d, MOD A2C: 38.6 ml LV vol d, MOD A4C: 38.8 ml LV vol s, MOD A2C: 15.7 ml LV vol s, MOD A4C: 17.9 ml LV SV MOD A2C:     22.9 ml LV SV MOD A4C:     38.8 ml LV SV MOD BP:      22.1 ml RIGHT VENTRICLE             IVC RV Basal diam:  2.70 cm     IVC diam: 1.70 cm RV S prime:     12.20 cm/s TAPSE (M-mode): 1.9 cm      PULMONARY VEINS                             Diastolic Velocity: 31.80 cm/s                             S/D Velocity:       1.20  Systolic Velocity:  37.50 cm/s LEFT ATRIUM           Index        RIGHT ATRIUM          Index LA diam:      2.50 cm 1.33 cm/m   RA Area:     8.74 cm LA Vol (A2C): 10.1 ml 5.37 ml/m   RA Volume:   18.70 ml 9.94 ml/m LA Vol (A4C): 20.1 ml 10.68 ml/m  AORTIC VALVE                     PULMONIC VALVE AV Area (Vmax):    1.81 cm      PV Vmax:       1.15 m/s AV Area (Vmean):   1.63 cm      PV Peak grad:  5.3 mmHg AV Area (VTI):     1.94 cm AV Vmax:           137.00 cm/s AV Vmean:          100.000 cm/s AV VTI:            0.211 m AV Peak Grad:      7.5 mmHg AV Mean Grad:      5.0 mmHg LVOT Vmax:         97.40 cm/s LVOT Vmean:        63.900 cm/s LVOT VTI:          0.161 m LVOT/AV VTI ratio: 0.76  AORTA Ao Root diam: 2.50 cm Ao Asc diam:  3.50 cm MITRAL VALVE MV Area (PHT): 4.26 cm    SHUNTS MV Decel Time: 178 msec    Systemic VTI:  0.16 m MV E velocity: 63.40 cm/s  Systemic Diam: 1.80 cm MV A velocity: 76.50 cm/s MV E/A ratio:  0.83 Stanly Leavens MD Electronically signed by Stanly Leavens MD Signature Date/Time: 01/12/2024/9:31:46 AM    Final    CT Head Wo Contrast Result Date: 01/11/2024 CLINICAL DATA:  Unresponsive EXAM: CT HEAD WITHOUT CONTRAST CT CERVICAL SPINE WITHOUT CONTRAST TECHNIQUE: Multidetector CT imaging of the head and cervical spine was performed following the standard protocol without intravenous contrast. Multiplanar CT image reconstructions of the cervical spine were also generated. RADIATION DOSE REDUCTION: This exam was performed according to the departmental dose-optimization program which includes automated exposure control, adjustment of the mA and/or kV according to patient size and/or use of iterative reconstruction technique. COMPARISON:  None Available. FINDINGS: CT HEAD FINDINGS Brain: No acute territorial infarction, hemorrhage, or intracranial mass. The ventricles are nonenlarged. Vascular: No hyperdense vessels.  No unexpected calcification Skull: Normal. Negative for  fracture or focal lesion. Sinuses/Orbits: Mild mucosal thickening in the sinuses. Fluid and or debris in the posterior nasopharynx Other: None CT CERVICAL SPINE FINDINGS Alignment: No subluxation.  Facet alignment within normal limits Skull base and vertebrae: Slightly limited by motion degradation. No obvious fracture Soft tissues and spinal canal: Patent spinal canal. Fluid within the nasal and oropharynx. Disc levels: Relatively patent disc spaces. Minimal narrowing at C5-C6. No high-grade bony canal stenosis. Upper chest: Negative. Other: None IMPRESSION: 1. Negative non contrasted CT appearance of the brain. 2. Cervical CT slightly limited by motion degradation. No definite acute osseous abnormality of the cervical spine. Electronically Signed   By: Luke Bun M.D.   On: 01/11/2024 22:29   CT Cervical Spine Wo Contrast Result Date: 01/11/2024 CLINICAL DATA:  Unresponsive EXAM: CT HEAD WITHOUT CONTRAST CT CERVICAL SPINE WITHOUT CONTRAST  TECHNIQUE: Multidetector CT imaging of the head and cervical spine was performed following the standard protocol without intravenous contrast. Multiplanar CT image reconstructions of the cervical spine were also generated. RADIATION DOSE REDUCTION: This exam was performed according to the departmental dose-optimization program which includes automated exposure control, adjustment of the mA and/or kV according to patient size and/or use of iterative reconstruction technique. COMPARISON:  None Available. FINDINGS: CT HEAD FINDINGS Brain: No acute territorial infarction, hemorrhage, or intracranial mass. The ventricles are nonenlarged. Vascular: No hyperdense vessels.  No unexpected calcification Skull: Normal. Negative for fracture or focal lesion. Sinuses/Orbits: Mild mucosal thickening in the sinuses. Fluid and or debris in the posterior nasopharynx Other: None CT CERVICAL SPINE FINDINGS Alignment: No subluxation.  Facet alignment within normal limits Skull base and  vertebrae: Slightly limited by motion degradation. No obvious fracture Soft tissues and spinal canal: Patent spinal canal. Fluid within the nasal and oropharynx. Disc levels: Relatively patent disc spaces. Minimal narrowing at C5-C6. No high-grade bony canal stenosis. Upper chest: Negative. Other: None IMPRESSION: 1. Negative non contrasted CT appearance of the brain. 2. Cervical CT slightly limited by motion degradation. No definite acute osseous abnormality of the cervical spine. Electronically Signed   By: Luke Bun M.D.   On: 01/11/2024 22:29   DG Abdomen 1 View Result Date: 01/11/2024 CLINICAL DATA:  OG tube placement EXAM: ABDOMEN - 1 VIEW COMPARISON:  None Available. FINDINGS: Enteric tube tip and side port overlie the stomach. Moderate air distension of the stomach. IMPRESSION: Enteric tube tip and side port overlie the stomach. Electronically Signed   By: Luke Bun M.D.   On: 01/11/2024 20:33   DG Chest Portable 1 View Result Date: 01/11/2024 CLINICAL DATA:  Found down at work EXAM: PORTABLE CHEST 1 VIEW COMPARISON:  None Available. FINDINGS: Endotracheal tube tip about 5.1 cm superior to the carina. Enteric tube tip below the diaphragm but incompletely assessed. No acute airspace disease or effusion. Normal cardiac size. No pneumothorax IMPRESSION: Endotracheal tube tip about 5.1 cm superior to the carina. No acute airspace disease. Electronically Signed   By: Luke Bun M.D.   On: 01/11/2024 20:33     Patient Profile   43 y.o. male with history of HTN, HLD, DM who was admitted post cardiac arrest on the evening of 01/11/24. He collapsed at work but unknown downtime. He was in asystole when EMS arrived and ROSC achieved after 18 minutes of CPR. Echo with normal LV function without wall motion abnormality. No valve disease.   Assessment & Plan   Cardiac arrest: Unclear etiology. Echo with normal LV and RV function. Expected small rise in troponin post arrest. Will follow for now  with supportive measures. If he has neurological recovery, will plan ischemic evaluation.   For questions or updates, please contact  [facility] Please consult [email or web address] for contact info under   Signed, Lonni Cash, MD  01/13/2024, 9:48 AM

## 2024-01-13 NOTE — Progress Notes (Addendum)
 PHARMACY - ANTICOAGULATION CONSULT NOTE  Pharmacy Consult for heparin Indication: chest pain/ACS  Labs: Recent Labs    01/11/24 1951 01/11/24 1953 01/11/24 2103 01/11/24 2330 01/12/24 0251 01/12/24 1105 01/12/24 1209 01/12/24 2332  HGB 15.3  15.6   < > 15.6  --  15.1 13.4  --   --   HCT 45.3  46.0   < > 46.0  --  43.1 40.3  --   --   PLT 239  --   --   --  232 186  --   --   APTT 28  --   --   --   --   --   --   --   LABPROT 14.5  --   --   --   --   --   --   --   INR 1.1  --   --   --   --   --   --   --   HEPARINUNFRC  --   --   --   --   --  >1.10* 0.26* 0.53  CREATININE 1.45*  1.20  --   --  1.34* 1.16  --   --  0.83  TROPONINIHS 105*  --   --  1,126*  --   --   --   --    < > = values in this interval not displayed.   Assessment/Plan:  43yo male therapeutic on heparin after rate change. Will continue infusion at current rate of 1000 units/hr and confirm stable with am labs.  Marvetta Dauphin, PharmD, BCPS 01/13/2024 12:05 AM

## 2024-01-13 NOTE — Procedures (Addendum)
 Patient Name: Thomas Stephenson  MRN: [id number]  Epilepsy Attending: Arlin MALVA Krebs  Referring Physician/Provider: Jude Harden GAILS, MD  Duration: 01/12/2024 1125 to 01/13/2024 1125  Patient history: 43yo m s/p cardiac arrest. EEG to evaluate for seizure  Level of alertness: comatose  AEDs during EEG study: Propofol, VPA, LEV  Technical aspects: This EEG study was done with scalp electrodes positioned according to the 10-20 International system of electrode placement. Electrical activity was reviewed with band pass filter of 1-70Hz , sensitivity of 7 uV/mm, display speed of 55mm/sec with a 60Hz  notched filter applied as appropriate. EEG data were recorded continuously and digitally stored.  Video monitoring was available and reviewed as appropriate.  Description: At the beginning of the study, EEG showed burst suppression pattern with generalized highly epileptiform bursts lasting 1 to 3 seconds alternating with 2 to 4 seconds of generalized EEG suppression.  Gradually as medications were adjusted, the morphology of burst improved to continuous generalized 3 to 6 Hz theta-delta slowing.  Sedation was held on 01/13/2024 and gradually after around 0815, EEG worsened and showed generalized periodic epileptiform discharges at 1.5 to 2.5Hz . Hyperventilation and photic stimulation were not performed.     ABNORMALITY - Burst suppression with highly epileptiform burst, generalized - Continuous slow, generalized - Periodic epileptiform discharges, generalized  IMPRESSION: At the beginning of the study, EEG showed evidence of epileptogenicity with generalized onset. This EEG pattern was on the ictal-interictal continuum. As medications were adjusted, EEG improved and was suggestive of diffuse cerebral dysfunction (encephalopathy).  Sedation was held on 01/04/2024 and gradually after around 0815, EEG worsened and was again suggestive of epileptogenicity with generalized onset.  This EEG pattern was again on  ictal-interictal continuum.  Given the frequency of discharges, high suspicion for ictal nature.  Dr. Arora was notified.   Jalayla Chrismer O Mahogany [place]

## 2024-01-13 NOTE — Progress Notes (Signed)
 [facility] referral #: [id number]

## 2024-01-13 NOTE — Progress Notes (Signed)
 NAME:  Thomas Stephenson, MRN:  [id number], DOB:  Oct 14, 1980, LOS: 2 ADMISSION DATE:  01/11/2024, CONSULTATION DATE:  01/11/24 REFERRING MD:  EDP, CHIEF COMPLAINT:  cardiac arrest   History of Present Illness:  43 yo male presented after sudden collapse, cardiac arrest and bystander initiated cpr. Pt has unknown overall down time, when EMS arrived they performed acls x52mins with rosc. All history is obtained from chart review as pt is intubated and unresponsive.  He is currently on 4mcg epi. CTH/neck pending. EKG with rbbb (no old comparisons) cardiology consulted and will see, no STMI activation but would like heparin infusion should cth result allow.   Pertinent  Medical History  T2DM, HLD  Significant Hospital Events: Including procedures, antibiotic start and stop dates in addition to other pertinent events   Admitted to ICU 10/27   Interim History / Subjective:    Objective    Blood pressure (!) 151/87, pulse 97, temperature 98.4 F (36.9 C), resp. rate 15, height 5' 9 (1.753 m), weight 76.3 kg, SpO2 99%.    Vent Mode: PRVC FiO2 (%):  [30 %] 30 % Set Rate:  [15 bmp] 15 bmp Vt Set:  [570 mL] 570 mL PEEP:  [5 cmH20] 5 cmH20 Plateau Pressure:  [14 cmH20-16 cmH20] 15 cmH20   Intake/Output Summary (Last 24 hours) at 01/13/2024 0949 Last data filed at 01/13/2024 0700 Gross per 24 hour  Intake 1393.08 ml  Output 1100 ml  Net 293.08 ml   Filed Weights   01/12/24 0312 01/13/24 0500  Weight: 72.8 kg 76.3 kg    Examination: General: intubated, non-responsive, sedated HENT: anicteric sclera,  Lungs: CTAB, vent breath sounds Cardiovascular: tachycardic, regular, no mrg Neuro: PERRLA , no spontaneous movement Resolved problem list   Assessment and Plan  OOHCA s/p rosc Acute coronary syndrome  Cardiology following. Unclear etiology. No CTA for PE assessment but no significant RV dysfunction/dilation on TTE. Normal LV function without wall motion abnormality. Ischemic work-up  pending clinical improvement in brain function, for now Heparin, ASA, Statin, tele monitoring  Anoxic encephalopathy Myoclonic jerks Neurology following, appreciate recommendations. Continues on LT EEG with limited brain activity. No evidence of hard brainstem function on exam. For now will continue Keppra, Depakote. - Initiate hypothermia protocol with Tylenol, Arctic sun pads - Buspirone for shivering  Acute hypoxic resp failure req mechanical ventilation Not ready to wean due to above. Empiric Unasyn given concern for aspiration. -LTVV -VAP prevention protocol -PAD protocol -TF  Hyperglycemia with T2DM Within goal. SSI  Transaminitis, POA Likely shock liver. Trend.   Aki, nonoliguric, resolved  Labs   CBC: Recent Labs  Lab 01/11/24 1951 01/11/24 1953 01/11/24 2103 01/12/24 0251 01/12/24 1105 01/12/24 2349  WBC 10.5  --   --  16.7* 13.8* 14.3*  NEUTROABS 7.1  --   --   --   --   --   HGB 15.3  15.6 15.3 15.6 15.1 13.4 15.3  HCT 45.3  46.0 45.0 46.0 43.1 40.3 45.1  MCV 87.6  --   --  85.7 87.6 85.9  PLT 239  --   --  232 186 170    Basic Metabolic Panel: Recent Labs  Lab 01/11/24 1951 01/11/24 1953 01/11/24 2103 01/11/24 2330 01/12/24 0251 01/12/24 2332  NA 135  137 137 138  --  136 139  K 3.8  3.9 3.9 3.2*  --  4.6 3.9  CL 101  104  --   --   --  103 105  CO2 15*  --   --   --  20* 21*  GLUCOSE 340*  348*  --   --   --  328* 194*  BUN 16  19  --   --   --  20 13  CREATININE 1.45*  1.20  --   --  1.34* 1.16 0.83  CALCIUM 8.5*  --   --   --  8.3* 8.3*  MG 2.2  --   --   --  1.7 2.5*  PHOS  --   --   --   --  2.3* 2.7   GFR: Estimated Creatinine Clearance: 112.4 mL/min (by C-G formula based on SCr of 0.83 mg/dL). Recent Labs  Lab 01/11/24 1951 01/11/24 2340 01/12/24 0202 01/12/24 0251 01/12/24 1105 01/12/24 2349  WBC 10.5  --   --  16.7* 13.8* 14.3*  LATICACIDVEN  --  6.2* 6.0*  --   --   --     Liver Function Tests: Recent Labs  Lab  01/11/24 1951  AST 237*  ALT 332*  ALKPHOS 53  BILITOT 0.9  PROT 6.0*  ALBUMIN 3.7   No results for input(s): LIPASE, AMYLASE in the last 168 hours. No results for input(s): AMMONIA in the last 168 hours.  ABG    Component Value Date/Time   PHART 7.261 (L) 01/11/2024 2103   PCO2ART 36.1 01/11/2024 2103   PO2ART 599 (H) 01/11/2024 2103   HCO3 16.6 (L) 01/11/2024 2103   TCO2 18 (L) 01/11/2024 2103   ACIDBASEDEF 10.0 (H) 01/11/2024 2103   O2SAT 100 01/11/2024 2103     Coagulation Profile: Recent Labs  Lab 01/11/24 1951  INR 1.1    Cardiac Enzymes: No results for input(s): CKTOTAL, CKMB, CKMBINDEX, TROPONINI in the last 168 hours.  HbA1C: Hemoglobin A1C  Date/Time Value Ref Range Status  06/09/2023 07:45 AM 5.6 4.0 - 5.6 % Final   Hgb A1c MFr Bld  Date/Time Value Ref Range Status  11/04/2023 02:24 PM 6.2 (H) <5.7 % Final    Comment:    For someone without known diabetes, a hemoglobin  A1c value between 5.7% and 6.4% is consistent with prediabetes and should be confirmed with a  follow-up test. . For someone with known diabetes, a value <7% indicates that their diabetes is well controlled. A1c targets should be individualized based on duration of diabetes, age, comorbid conditions, and other considerations. . This assay result is consistent with an increased risk of diabetes. . Currently, no consensus exists regarding use of hemoglobin A1c for diagnosis of diabetes for children. .   02/02/2023 11:03 AM 9.6 (H) <5.7 % of total Hgb Final    Comment:    For someone without known diabetes, a hemoglobin A1c value of 6.5% or greater indicates that they may have  diabetes and this should be confirmed with a follow-up  test. . For someone with known diabetes, a value <7% indicates  that their diabetes is well controlled and a value  greater than or equal to 7% indicates suboptimal  control. A1c targets should be individualized based on   duration of diabetes, age, comorbid conditions, and  other considerations. . Currently, no consensus exists regarding use of hemoglobin A1c for diagnosis of diabetes for children. .     CBG: Recent Labs  Lab 01/12/24 1525 01/12/24 1929 01/12/24 2329 01/13/24 0312 01/13/24 0759  GLUCAP 175* 156* 178* 149* 142*    Review of Systems:     Past Medical History:  He,  has a past medical history of Diabetes mellitus without complication (HCC) and Hypertension.   Surgical History:  No past surgical history on file.   Social History:   reports that he has never smoked. He has never used smokeless tobacco. He reports current alcohol use. He reports that he does not use drugs.   Family History:  His family history includes Alcohol abuse in his father; Depression in his father; Drug abuse in his father; Schizophrenia in his father; Thyroid  disease in his mother. There is no history of Colon cancer or Stomach cancer.   Allergies Allergies  Allergen Reactions   Mushroom Extract Complex (Obsolete)     Unknown reaction     Home Medications  Prior to Admission medications   Medication Sig Start Date End Date Taking? Authorizing Provider  Continuous Glucose Sensor (DEXCOM G7 SENSOR) MISC Every 10 days 06/30/23  Yes Pender, Julie F, FNP  glipiZIDE  (GLUCOTROL ) 10 MG tablet Take 1 tablet (10 mg total) by mouth 2 (two) times daily before a meal. 11/04/23  Yes Gareth Mliss FALCON, FNP  insulin  glargine, 2 Unit Dial, (TOUJEO  MAX SOLOSTAR) 300 UNIT/ML Solostar Pen Inject 16 Units into the skin daily at 6 (six) AM. 11/04/23  Yes Gareth Mliss FALCON, FNP  lisinopril  (ZESTRIL ) 5 MG tablet Take 1 tablet (5 mg total) by mouth daily. 11/04/23  Yes Gareth Mliss FALCON, FNP  saxagliptin  HCl (ONGLYZA) 5 MG TABS tablet TAKE 1 TABLET BY MOUTH DAILY 10/19/23  Yes Pender, Julie F, FNP  BD PEN NEEDLE MICRO ULTRAFINE 32G X 6 MM MISC 1 EACH BY DOES NOT APPLY ROUTE DAILY AT 6 (SIX) AM. 08/05/23   Gareth Mliss FALCON, FNP   Glucagon , rDNA, (GLUCAGON  EMERGENCY) 1 MG KIT Inject 1 mg into the muscle as needed (hypoglycemia). 10/06/23   Pender, Julie F, FNP   Ozzie Remmers, DO

## 2024-01-13 NOTE — Plan of Care (Signed)
  Problem: Activity: Goal: Ability to tolerate increased activity will improve Outcome: Not Progressing   Problem: Skin Integrity: Goal: Risk for impaired skin integrity will decrease Outcome: Progressing   Problem: Clinical Measurements: Goal: Ability to maintain clinical measurements within normal limits will improve Outcome: Not Progressing Goal: Diagnostic test results will improve Outcome: Not Progressing

## 2024-01-13 NOTE — Progress Notes (Signed)
vLTM maintenance  All impedances below 10k.  No skin breakdown noted at FP1  FP2  A1  A2

## 2024-01-13 NOTE — Progress Notes (Signed)
 NEUROLOGY CONSULT FOLLOW UP NOTE   Date of service: January 13, 2024 Patient Name: Thomas Stephenson MRN:  [id number] DOB:  07-29-1980  Interval Hx/subjective  Patient seen and examined. Remained on sedation overnight No twitching noted on all sedation overnight  Vitals   Vitals:   01/13/24 0500 01/13/24 0600 01/13/24 0700 01/13/24 0748  BP: (!) 160/93 (!) 157/90 (!) 151/87   Pulse: 94 92 94 97  Resp: 15 15 15 15   Temp: 98.2 F (36.8 C) 98.2 F (36.8 C) 98.2 F (36.8 C) 98.4 F (36.9 C)  TempSrc:      SpO2: 99% 99% 99% 99%  Weight: 76.3 kg     Height:         Body mass index is 24.84 kg/m.  Physical Exam  General: Well-developed well-nourished man, sedated, intubated. HEENT: Normocephalic/atraumatic Lungs: Vented Cardiovascular: Regular rhythm Neurological exam Sedation with propofol held for exam He is intubated No spontaneous movement No twitching noted Pupils are equal sluggishly reactive to light. Corneal reflexes are absent No gag Cough present. He is breathing over the ventilator To noxious stimulation, he has extension in both upper extremities. No response in lower extremities.   Medications  Current Facility-Administered Medications:    0.9 %  sodium chloride infusion, 250 mL, Intravenous, Continuous, Jude Harden GAILS, MD, Last Rate: 20 mL/hr at 01/13/24 0700, Infusion Verify at 01/13/24 0700   acetaminophen (TYLENOL) 160 MG/5ML solution 650 mg, 650 mg, Per Tube, Q4H PRN, Alva, Rakesh V, MD   acetaminophen (TYLENOL) tablet 650 mg, 650 mg, Per Tube, Q4H, 650 mg at 01/13/24 0350 **OR** acetaminophen (TYLENOL) 160 MG/5ML solution 650 mg, 650 mg, Per Tube, Q4H, 650 mg at 01/12/24 2059 **OR** acetaminophen (TYLENOL) suppository 650 mg, 650 mg, Rectal, Q4H, Alva, Rakesh V, MD   Ampicillin-Sulbactam (UNASYN) 3 g in sodium chloride 0.9 % 100 mL IVPB, 3 g, Intravenous, Q6H, Gaines Carrier, RPH, Stopped at 01/13/24 9372   aspirin chewable tablet 81 mg, 81 mg, Per  Tube, Daily, Ogan, Okoronkwo U, MD, 81 mg at 01/13/24 0815   busPIRone (BUSPAR) tablet 30 mg, 30 mg, Per Tube, Q8H **OR** busPIRone (BUSPAR) tablet 30 mg, 30 mg, Per Tube, Q8H, Alva, Rakesh V, MD, 30 mg at 01/13/24 0350   Chlorhexidine Gluconate Cloth 2 % PADS 6 each, 6 each, Topical, Daily, Layman Raisin, DO, 6 each at 01/12/24 0826   docusate (COLACE) 50 MG/5ML liquid 100 mg, 100 mg, Per Tube, BID PRN, Layman Raisin, DO   famotidine (PEPCID) tablet 20 mg, 20 mg, Per Tube, BID, Layman Raisin, DO, 20 mg at 01/13/24 0815   fentaNYL (SUBLIMAZE) injection 25-100 mcg, 25-100 mcg, Intravenous, Q2H PRN, Alva, Rakesh V, MD   heparin ADULT infusion 100 units/mL (25000 units/250mL), 1,000 Units/hr, Intravenous, Continuous, Merilee Linsey I, RPH, Last Rate: 10 mL/hr at 01/13/24 0700, 1,000 Units/hr at 01/13/24 0700   insulin  aspart (novoLOG) injection 0-15 Units, 0-15 Units, Subcutaneous, Q4H, Layman Raisin, DO, 2 Units at 01/13/24 0815   levETIRAcetam (KEPPRA) undiluted injection 500 mg, 500 mg, Intravenous, Q12H, Carsyn Boster, MD, 500 mg at 01/13/24 0815   metoprolol tartrate (LOPRESSOR) injection 2.5 mg, 2.5 mg, Intravenous, Q4H PRN, Ogan, Okoronkwo U, MD, 2.5 mg at 01/13/24 0350   midazolam PF (VERSED) injection 2 mg, 2 mg, Intravenous, Q15 min PRN, Alva, Rakesh V, MD   midazolam PF (VERSED) injection 2 mg, 2 mg, Intravenous, Q2H PRN, Alva, Rakesh V, MD   nitroGLYCERIN (NITROGLYN) 2 % ointment 0.5 inch, 0.5 inch, Topical, Q6H, Ogan,  Marcellina PENNER, MD, 0.5 inch at 01/13/24 9394   norepinephrine (LEVOPHED) 4mg  in 250mL (0.016 mg/mL) premix infusion, 0-10 mcg/min, Intravenous, Titrated, Jude Harden GAILS, MD, Held at 01/12/24 1025   Oral care mouth rinse, 15 mL, Mouth Rinse, Q2H, Layman Raisin, DO, 15 mL at 01/13/24 0605   Oral care mouth rinse, 15 mL, Mouth Rinse, PRN, Layman Raisin, DO   polyethylene glycol (MIRALAX / GLYCOLAX) packet 17 g, 17 g, Per Tube, Daily PRN, Layman Raisin, DO   propofol (DIPRIVAN) 1000 MG/100ML infusion, 0-100 mcg/kg/min, Intravenous, Continuous, Shafer, Devon, NP, Last Rate: 26.2 mL/hr at 01/13/24 0558, 60 mcg/kg/min at 01/13/24 0558   valproate (DEPACON) 500 mg in dextrose 5 % 50 mL IVPB, 500 mg, Intravenous, Q12H, Remi Pippin, NP, Stopped at 01/12/24 2208  Labs and Diagnostic Imaging   CBC:  Recent Labs  Lab 01/11/24 1951 01/11/24 1953 01/12/24 1105 01/12/24 2349  WBC 10.5   < > 13.8* 14.3*  NEUTROABS 7.1  --   --   --   HGB 15.3  15.6   < > 13.4 15.3  HCT 45.3  46.0   < > 40.3 45.1  MCV 87.6   < > 87.6 85.9  PLT 239   < > 186 170   < > = values in this interval not displayed.    Basic Metabolic Panel:  Lab Results  Component Value Date   NA 139 01/12/2024   K 3.9 01/12/2024   CO2 21 (L) 01/12/2024   GLUCOSE 194 (H) 01/12/2024   BUN 13 01/12/2024   CREATININE 0.83 01/12/2024   CALCIUM 8.3 (L) 01/12/2024   GFRNONAA >60 01/12/2024   Lipid Panel:  Lab Results  Component Value Date   LDLCALC 116 (H) 01/11/2024   HgbA1c:  Lab Results  Component Value Date   HGBA1C 6.2 (H) 11/04/2023   Urine Drug Screen:     Component Value Date/Time   LABOPIA NONE DETECTED 01/11/2024 1958   COCAINSCRNUR NONE DETECTED 01/11/2024 1958   LABBENZ POSITIVE (A) 01/11/2024 1958   AMPHETMU NONE DETECTED 01/11/2024 1958   THCU NONE DETECTED 01/11/2024 1958   LABBARB NONE DETECTED 01/11/2024 1958    Alcohol Level     Component Value Date/Time   ETH <15 01/11/2024 1951   INR  Lab Results  Component Value Date   INR 1.1 01/11/2024   APTT  Lab Results  Component Value Date   APTT 28 01/11/2024   Arrival CT Head without contrast(Personally reviewed): No acute findings  Assessment   Thomas Stephenson is a 43 y.o. male with history of diabetes and hypertension who had a out-of-hospital cardiac arrest with unknown downtime prior to EMS arrival and then about 18 minutes of CPR by EMS prior to ROSC, was started exhibiting  generalized myoclonic twitching.  Was loaded with Keppra, Depakote was added after 20 mg/kg load. He remains on propofol with resolution of clinical twitching. His EEG showed epileptiform bursts which showed some improvement with sedation. Overnight LTM read is pending at this time Examination remains poor with some brainstem reflexes present but no evidence of hard brainstem function.  Impression: Concern for anoxic brain injury status postcardiac arrest  Recommendations  Continue current supportive treatment Continue Keppra Continue Depakote Keep off of propofol for now and repeat exams.  Resume propofol if he has clinical twitching or starts to be dyssynchronous to the ventilator. Had a detailed discussion at the bedside with the wife to explain my exam and the possibility of anoxic brain injury  and poor outcome.  She is very cognizant of damage that can occur to the brain in the setting of cardiac arrest and hypoperfusion, and is aware that he may not have a good outcome.  For now we will continue full support for 72 hours before formal prognostication. Plan was also discussed with intensivist Dr. Gretta at the bedside ______________________________________________________________________   Signed, Eligio Lav, MD [facility]   CRITICAL CARE ATTESTATION Performed by: Eligio Lav, MD Total critical care time: 33 minutes Critical care time was exclusive of separately billable procedures and treating other patients and/or supervising APPs/Residents/Students Critical care was necessary to treat or prevent imminent or life-threatening deterioration. This patient is critically ill and at significant risk for neurological worsening and/or death and care requires constant monitoring. Critical care was time spent personally by me on the following activities: development of treatment plan with patient and/or surrogate as well as nursing, discussions with consultants, evaluation of  patient's response to treatment, examination of patient, obtaining history from patient or surrogate, ordering and performing treatments and interventions, ordering and review of laboratory studies, ordering and review of radiographic studies, pulse oximetry, re-evaluation of patient's condition, participation in multidisciplinary rounds and medical decision making of high complexity in the care of this patient.

## 2024-01-13 NOTE — Progress Notes (Signed)
 Initial Nutrition Assessment  DOCUMENTATION CODES:  Not applicable  INTERVENTION:  Initiate tube feeding via OGT: Vital 1.5 at 50 ml/h (1200 ml per day) Start at 20 and advance by 10mL every 12 hours to reach goal Prosource TF20 60 ml 1x/d Provides 1880 kcal, 101 gm protein, 917 ml free water daily Pt on a significant amount of propofol at this time. When sedation is decreased, increase TF rate to 55 which will provide 2060 kcal, 109 gm protein, 1009 ml free water daily  NUTRITION DIAGNOSIS:  Inadequate oral intake related to inability to eat as evidenced by NPO status.  GOAL:  Patient will meet greater than or equal to 90% of their needs  MONITOR:  TF tolerance, Vent status, I & O's, Labs  REASON FOR ASSESSMENT:  Ventilator, Consult Enteral/tube feeding initiation and management  ASSESSMENT:  Pt with hx of DM type 2, HLD, and HTN presented to ED after collapsing at work. Found by bystander who started CPR until EMS arrived. Unknown downtime.   Patient is currently intubated on ventilator support. Aunt at bedside able to provide some nutrition history. States that pt's appetite has been stable and no major weight changes recently. States that up until the day of admission, pt was eating and activity level was normal.  Discussed with MD, will start enteral nutrition.   Pt showing signs of ABI. Per neurology, waiting 72 hours post event to evaluate damage.  MV: 8.3 L/min Temp (24hrs), Avg:98 F (36.7 C), Min:97.2 F (36.2 C), Max:98.6 F (37 C) MAP (cuff): 99-119 mmHg this shift  Propofol: 26.2 ml/hr (692 kcal/d)  Admit weight: 72.8 kg Current weight: 76.3 kg    Intake/Output Summary (Last 24 hours) at 01/13/2024 1539 Last data filed at 01/13/2024 0700 Gross per 24 hour  Intake 1393.08 ml  Output 1100 ml  Net 293.08 ml  Net IO Since Admission: 456.87 mL [01/13/24 1539]  Drains/Lines: OGT 16 Fr.  UOP x 24 hours  Nutritionally Relevant  Medications: Scheduled Meds:  atorvastatin  40 mg Per Tube Daily   famotidine  20 mg Per Tube BID   PROSource TF20  60 mL Per Tube Daily   insulin  aspart  0-15 Units Subcutaneous Q4H   levETIRAcetam  500 mg Intravenous Q12H   Continuous Infusions:  ampicillin-sulbactam (UNASYN) IV 3 g (01/13/24 1157)   feeding supplement (VITAL 1.5 CAL)     norepinephrine (LEVOPHED) Adult infusion Stopped (01/12/24 1025)   propofol (DIPRIVAN) infusion 60 mcg/kg/min (01/13/24 0558)   PRN Meds: docusate, polyethylene glycol  Labs Reviewed: CBG ranges from 142-178 mg/dL over the last 24 hours HgbA1c 6.2%  NUTRITION - FOCUSED PHYSICAL EXAM: Flowsheet Row Most Recent Value  Orbital Region No depletion  Upper Arm Region No depletion  Thoracic and Lumbar Region No depletion  Buccal Region No depletion  Temple Region No depletion  Clavicle Bone Region No depletion  Clavicle and Acromion Bone Region No depletion  Scapular Bone Region No depletion  Dorsal Hand No depletion  Patellar Region No depletion  Anterior Thigh Region No depletion  Posterior Calf Region No depletion  Edema (RD Assessment) Mild  [hands]  Hair Reviewed  Eyes Reviewed  Mouth Reviewed  Skin Reviewed  Nails Reviewed    Diet Order:   Diet Order             Diet NPO time specified  Diet effective now                   EDUCATION  NEEDS:  Not appropriate for education at this time  Skin:  Skin Assessment: Skin Integrity Issues: Stage 1: - coccyx (5 x 6 cm)  Last BM:  PTA  Height:  Ht Readings from Last 1 Encounters:  01/12/24 5' 9 (1.753 m)    Weight:  Wt Readings from Last 1 Encounters:  01/13/24 76.3 kg    Ideal Body Weight:  72.7 kg  BMI:  Body mass index is 24.84 kg/m.  Estimated Nutritional Needs:  Kcal:  1900-2100 kcal/d Protein:  95-115g/d Fluid:  >/=2L/d    Vernell Lukes, RD, LDN, CNSC Registered Dietitian II Please reach out via secure chat

## 2024-01-14 ENCOUNTER — Inpatient Hospital Stay (HOSPITAL_COMMUNITY)

## 2024-01-14 DIAGNOSIS — G931 Anoxic brain damage, not elsewhere classified: Secondary | ICD-10-CM | POA: Diagnosis not present

## 2024-01-14 DIAGNOSIS — I469 Cardiac arrest, cause unspecified: Secondary | ICD-10-CM | POA: Diagnosis not present

## 2024-01-14 DIAGNOSIS — J9601 Acute respiratory failure with hypoxia: Secondary | ICD-10-CM | POA: Diagnosis not present

## 2024-01-14 DIAGNOSIS — G40409 Other generalized epilepsy and epileptic syndromes, not intractable, without status epilepticus: Secondary | ICD-10-CM | POA: Diagnosis not present

## 2024-01-14 DIAGNOSIS — R569 Unspecified convulsions: Secondary | ICD-10-CM | POA: Diagnosis not present

## 2024-01-14 DIAGNOSIS — E1165 Type 2 diabetes mellitus with hyperglycemia: Secondary | ICD-10-CM | POA: Diagnosis not present

## 2024-01-14 LAB — HEMOGLOBIN A1C
Hgb A1c MFr Bld: 6.1 % — ABNORMAL HIGH (ref 4.8–5.6)
Mean Plasma Glucose: 128.37 mg/dL

## 2024-01-14 LAB — COMPREHENSIVE METABOLIC PANEL WITH GFR
ALT: 105 U/L — ABNORMAL HIGH (ref 0–44)
AST: 91 U/L — ABNORMAL HIGH (ref 15–41)
Albumin: 2.7 g/dL — ABNORMAL LOW (ref 3.5–5.0)
Alkaline Phosphatase: 35 U/L — ABNORMAL LOW (ref 38–126)
Anion gap: 11 (ref 5–15)
BUN: 17 mg/dL (ref 6–20)
CO2: 23 mmol/L (ref 22–32)
Calcium: 8.9 mg/dL (ref 8.9–10.3)
Chloride: 110 mmol/L (ref 98–111)
Creatinine, Ser: 0.93 mg/dL (ref 0.61–1.24)
GFR, Estimated: >60 mL/min (ref >60)
Glucose, Bld: 201 mg/dL — ABNORMAL HIGH (ref 70–99)
Potassium: 3.6 mmol/L (ref 3.5–5.1)
Sodium: 144 mmol/L (ref 135–145)
Total Bilirubin: 0.7 mg/dL (ref 0.0–1.2)
Total Protein: 5.2 g/dL — ABNORMAL LOW (ref 6.5–8.1)

## 2024-01-14 LAB — HEPARIN LEVEL (UNFRACTIONATED): Heparin Unfractionated: 0.22 [IU]/mL — ABNORMAL LOW (ref 0.30–0.70)

## 2024-01-14 LAB — CBC
HCT: 36.5 % — ABNORMAL LOW (ref 39.0–52.0)
Hemoglobin: 12.5 g/dL — ABNORMAL LOW (ref 13.0–17.0)
MCH: 29.8 pg (ref 26.0–34.0)
MCHC: 34.2 g/dL (ref 30.0–36.0)
MCV: 87.1 fL (ref 80.0–100.0)
Platelets: 175 K/uL (ref 150–400)
RBC: 4.19 MIL/uL — ABNORMAL LOW (ref 4.22–5.81)
RDW: 12.6 % (ref 11.5–15.5)
WBC: 9.5 K/uL (ref 4.0–10.5)
nRBC: 0 % (ref 0.0–0.2)

## 2024-01-14 LAB — GLUCOSE, CAPILLARY
Glucose-Capillary: 205 mg/dL — ABNORMAL HIGH (ref 70–99)
Glucose-Capillary: 235 mg/dL — ABNORMAL HIGH (ref 70–99)
Glucose-Capillary: 255 mg/dL — ABNORMAL HIGH (ref 70–99)
Glucose-Capillary: 283 mg/dL — ABNORMAL HIGH (ref 70–99)
Glucose-Capillary: 183 mg/dL — ABNORMAL HIGH (ref 70–99)
Glucose-Capillary: 263 mg/dL — ABNORMAL HIGH (ref 70–99)

## 2024-01-14 LAB — PHOSPHORUS: Phosphorus: 2.1 mg/dL — ABNORMAL LOW (ref 2.5–4.6)

## 2024-01-14 LAB — MAGNESIUM: Magnesium: 2.3 mg/dL (ref 1.7–2.4)

## 2024-01-14 MED ORDER — POTASSIUM CHLORIDE 20 MEQ PO PACK
40.0000 meq | PACK | Freq: Once | ORAL | Status: AC
  Administered 2024-01-14: 40 meq
  Filled 2024-01-14: qty 2

## 2024-01-14 MED ORDER — SODIUM CHLORIDE 0.9 % IV SOLN: 800.0000 mg | Freq: Once | INTRAVENOUS | Status: DC

## 2024-01-14 MED ORDER — POTASSIUM & SODIUM PHOSPHATES 280-160-250 MG PO PACK
2.0000 | PACK | ORAL | Status: AC
  Administered 2024-01-14 (×3): 2
  Filled 2024-01-14: qty 2
  Filled 2024-01-14: qty 1
  Filled 2024-01-14: qty 2

## 2024-01-14 MED ORDER — IBUPROFEN 800 MG/200ML IV SOLN
800.0000 mg | Freq: Once | INTRAVENOUS | Status: AC
  Administered 2024-01-14: 800 mg via INTRAVENOUS
  Filled 2024-01-14: qty 200

## 2024-01-14 MED ORDER — SENNA 8.6 MG PO TABS
1.0000 | ORAL_TABLET | Freq: Every day | ORAL | Status: DC
  Administered 2024-01-14 – 2024-01-15 (×2): 8.6 mg
  Filled 2024-01-14 (×2): qty 1

## 2024-01-14 MED ORDER — IBUPROFEN 100 MG/5ML PO SUSP
600.0000 mg | Freq: Four times a day (QID) | ORAL | Status: DC | PRN
  Filled 2024-01-14: qty 30

## 2024-01-14 MED ORDER — INSULIN ASPART 100 UNIT/ML IJ SOLN
4.0000 [IU] | INTRAMUSCULAR | Status: DC
  Administered 2024-01-14 – 2024-01-15 (×8): 4 [IU] via SUBCUTANEOUS

## 2024-01-14 MED ORDER — INSULIN GLARGINE-YFGN 100 UNIT/ML ~~LOC~~ SOLN
5.0000 [IU] | Freq: Every day | SUBCUTANEOUS | Status: DC
  Administered 2024-01-14: 5 [IU] via SUBCUTANEOUS
  Filled 2024-01-14: qty 0.05

## 2024-01-14 MED ORDER — INSULIN GLARGINE-YFGN 100 UNIT/ML ~~LOC~~ SOLN
8.0000 [IU] | Freq: Two times a day (BID) | SUBCUTANEOUS | Status: DC
  Administered 2024-01-14 – 2024-01-15 (×2): 8 [IU] via SUBCUTANEOUS
  Filled 2024-01-14 (×3): qty 0.08

## 2024-01-14 NOTE — Progress Notes (Signed)
 MB informed by [facility] that Unit [address] has a Psychologist, Educational. Troubleshooting multiple issue with no success. Mb switched out unit 18 for another and restarted to No further Video Error and [facility] verified they could now see Patient's LTM EEG.

## 2024-01-14 NOTE — Progress Notes (Signed)
 MB performed maintenance on P8 and O1 electrodes. All impedances are below 10k ohms. No skin breakdown noted.

## 2024-01-14 NOTE — Progress Notes (Signed)
 LTM maint complete - no skin breakdown seen Repaired O2 [facility] monitored, Event button test confirmed by [facility].

## 2024-01-14 NOTE — Progress Notes (Signed)
 [facility] ADULT ICU REPLACEMENT PROTOCOL   The patient does apply for the [facility] Adult ICU Electrolyte Replacment Protocol based on the criteria listed below:   1.Exclusion criteria: TCTS, ECMO, Dialysis, and Myasthenia Gravis patients 2. Is GFR >/= 30 ml/min? Yes.    Patient's GFR today is >60 3. Is SCr </= 2? Yes.   Patient's SCr is 0.93 mg/dL 4. Did SCr increase >/= 0.5 in 24 hours? No. 5.Pt's weight >40kg  Yes.   6. Abnormal electrolyte(s): K+ =3.6, Phos = 2.1  7. Electrolytes replaced per protocol 8.  Call MD STAT for K+ </= 2.5, Phos </= 1, or Mag </= 1 Physician:  Epimenio, eMD   Thomas Stephenson 01/14/2024 5:41 AM

## 2024-01-14 NOTE — Progress Notes (Signed)
 NEUROLOGY CONSULT FOLLOW UP NOTE   Date of service: January 14, 2024 Patient Name: Thomas Stephenson MRN:  [id number] DOB:  [birth year]  Interval Hx/subjective  Seen and examined On propofol-sedation held for exam  Vitals   Vitals:   01/14/24 0500 01/14/24 0700 01/14/24 0750 01/14/24 0800  BP: 110/64 112/66  131/75  Pulse: 88 83 83 81  Resp: 15 15 15 14   Temp: 100 F (37.8 C) 100 F (37.8 C) 100 F (37.8 C) 100 F (37.8 C)  TempSrc:      SpO2: 98% 99% 98% 98%  Weight: 73.3 kg     Height:         Body mass index is 23.86 kg/m.  Physical Exam  General: Well-developed well-nourished man, sedated, intubated. HEENT: Normocephalic/atraumatic Lungs: Vented Cardiovascular: Regular rhythm Neurological exam Sedation with propofol held for exam He is intubated No spontaneous movement No twitching noted-wife reports that he had some twitching overnight Pupils are equal sluggishly reactive to light. Corneal reflexes are absent Minimal cough and gag. He is breathing over the ventilator To noxious stimulation, he has extension in both upper extremities.  Similar to yesterday. No response in lower extremities.   Medications  Current Facility-Administered Medications:    acetaminophen (TYLENOL) 160 MG/5ML solution 650 mg, 650 mg, Per Tube, Q4H PRN, Alva, Rakesh V, MD   acetaminophen (TYLENOL) tablet 650 mg, 650 mg, Per Tube, Q4H, 650 mg at 01/13/24 2135 **OR** acetaminophen (TYLENOL) 160 MG/5ML solution 650 mg, 650 mg, Per Tube, Q4H, 650 mg at 01/14/24 0843 **OR** acetaminophen (TYLENOL) suppository 650 mg, 650 mg, Rectal, Q4H, Alva, Rakesh V, MD   Ampicillin-Sulbactam (UNASYN) 3 g in sodium chloride 0.9 % 100 mL IVPB, 3 g, Intravenous, Q6H, Gaines Carrier, RPH, Last Rate: 200 mL/hr at 01/14/24 0600, Infusion Verify at 01/14/24 0600   aspirin chewable tablet 81 mg, 81 mg, Per Tube, Daily, Ogan, Okoronkwo U, MD, 81 mg at 01/14/24 0841   atorvastatin (LIPITOR) tablet 40 mg, 40 mg, Per  Tube, Daily, Gretta Doffing P, DO, 40 mg at 01/14/24 0841   busPIRone (BUSPAR) tablet 30 mg, 30 mg, Per Tube, Q8H, 30 mg at 01/14/24 0333 **OR** busPIRone (BUSPAR) tablet 30 mg, 30 mg, Per Tube, Q8H, Alva, Rakesh V, MD, 30 mg at 01/13/24 2134   Chlorhexidine Gluconate Cloth 2 % PADS 6 each, 6 each, Topical, Daily, Layman Raisin, DO, 6 each at 01/13/24 0932   docusate (COLACE) 50 MG/5ML liquid 100 mg, 100 mg, Per Tube, BID PRN, Layman Raisin, DO   feeding supplement (PROSource TF20) liquid 60 mL, 60 mL, Per Tube, Daily, Gretta Doffing P, DO, 60 mL at 01/14/24 0841   feeding supplement (VITAL 1.5 CAL) liquid 1,000 mL, 1,000 mL, Per Tube, Continuous, Gretta Doffing SQUIBB, DO, Last Rate: 20 mL/hr at 01/14/24 0600, Infusion Verify at 01/14/24 0600   fentaNYL (SUBLIMAZE) injection 25-100 mcg, 25-100 mcg, Intravenous, Q2H PRN, Alva, Rakesh V, MD, 100 mcg at 01/14/24 0332   insulin  aspart (novoLOG) injection 0-15 Units, 0-15 Units, Subcutaneous, Q4H, Layman Raisin, DO, 5 Units at 01/14/24 0841   insulin  aspart (novoLOG) injection 2 Units, 2 Units, Subcutaneous, Q4H, Gretta Doffing SQUIBB, DO, 2 Units at 01/14/24 0842   levETIRAcetam (KEPPRA) undiluted injection 500 mg, 500 mg, Intravenous, Q12H, Oletha Tolson, MD, 500 mg at 01/14/24 0837   metoprolol tartrate (LOPRESSOR) injection 2.5 mg, 2.5 mg, Intravenous, Q4H PRN, Ogan, Okoronkwo U, MD, 2.5 mg at 01/13/24 0350   midazolam PF (VERSED) injection 2 mg, 2 mg, Intravenous,  Q15 min PRN, Alva, Rakesh V, MD   midazolam PF (VERSED) injection 2 mg, 2 mg, Intravenous, Q2H PRN, Alva, Rakesh V, MD, 2 mg at 01/13/24 2353   nitroGLYCERIN (NITROGLYN) 2 % ointment 0.5 inch, 0.5 inch, Topical, Q6H, Ogan, Okoronkwo U, MD, 0.5 inch at 01/14/24 0548   norepinephrine (LEVOPHED) 4mg  in 250mL (0.016 mg/mL) premix infusion, 0-10 mcg/min, Intravenous, Titrated, Jude Harden GAILS, MD, Held at 01/12/24 1025   Oral care mouth rinse, 15 mL, Mouth Rinse, Q2H, Layman Raisin, DO, 15 mL at  01/14/24 9157   Oral care mouth rinse, 15 mL, Mouth Rinse, PRN, Layman Raisin, DO   pantoprazole (PROTONIX) injection 40 mg, 40 mg, Intravenous, Q24H, Gretta Leita SQUIBB, DO, 40 mg at 01/13/24 2135   polyethylene glycol (MIRALAX / GLYCOLAX) packet 17 g, 17 g, Per Tube, Daily PRN, Layman Raisin, DO   potassium & sodium phosphates (PHOS-NAK) 280-160-250 MG packet 2 packet, 2 packet, Per Tube, Q4H, Ogan, Okoronkwo U, MD, 2 packet at 01/14/24 0841   propofol (DIPRIVAN) 1000 MG/100ML infusion, 0-100 mcg/kg/min, Intravenous, Continuous, Shafer, Jorene, NP, Last Rate: 17.47 mL/hr at 01/14/24 0555, 40 mcg/kg/min at 01/14/24 0555   valproate (DEPACON) 500 mg in dextrose 5 % 50 mL IVPB, 500 mg, Intravenous, Q12H, Shafer, Jorene, NP, Last Rate: 55 mL/hr at 01/14/24 0846, 500 mg at 01/14/24 0846  Labs and Diagnostic Imaging   CBC:  Recent Labs  Lab 01/11/24 1951 01/11/24 1953 01/12/24 2349 01/14/24 0244  WBC 10.5   < > 14.3* 9.5  NEUTROABS 7.1  --   --   --   HGB 15.3  15.6   < > 15.3 12.5*  HCT 45.3  46.0   < > 45.1 36.5*  MCV 87.6   < > 85.9 87.1  PLT 239   < > 170 175   < > = values in this interval not displayed.    Basic Metabolic Panel:  Lab Results  Component Value Date   NA 144 01/14/2024   K 3.6 01/14/2024   CO2 23 01/14/2024   GLUCOSE 201 (H) 01/14/2024   BUN 17 01/14/2024   CREATININE 0.93 01/14/2024   CALCIUM 8.9 01/14/2024   GFRNONAA >60 01/14/2024   Lipid Panel:  Lab Results  Component Value Date   LDLCALC 116 (H) 01/11/2024   HgbA1c:  Lab Results  Component Value Date   HGBA1C 6.1 (H) 01/14/2024   Urine Drug Screen:     Component Value Date/Time   LABOPIA NONE DETECTED 01/11/2024 1958   COCAINSCRNUR NONE DETECTED 01/11/2024 1958   LABBENZ POSITIVE (A) 01/11/2024 1958   AMPHETMU NONE DETECTED 01/11/2024 1958   THCU NONE DETECTED 01/11/2024 1958   LABBARB NONE DETECTED 01/11/2024 1958    Alcohol Level     Component Value Date/Time   ETH <15 01/11/2024  1951   INR  Lab Results  Component Value Date   INR 1.1 01/11/2024   APTT  Lab Results  Component Value Date   APTT 28 01/11/2024   Arrival CT Head without contrast(Personally reviewed): No acute findings  Assessment   Cortlan Dolin is a 43 y.o. male with history of diabetes and hypertension who had a out-of-hospital cardiac arrest with unknown downtime prior to EMS arrival and then about 18 minutes of CPR by EMS prior to ROSC, was started exhibiting generalized myoclonic twitching.  Was loaded with Keppra, Depakote was added after 20 mg/kg load. He remains on propofol with resolution of clinical twitching. His EEG showed epileptiform bursts which  showed some improvement with sedation. Overnight LTM read is pending at this time Examination remains poor with some brainstem reflexes present but no evidence of hard brainstem function.  Impression: Concern for anoxic brain injury status postcardiac arrest  Recommendations  Continue current supportive treatment Continue Keppra Continue Depakote Keep off of propofol for now and repeat exams.  Resume propofol if he has clinical twitching or starts to be dyssynchronous to the ventilator. Had a detailed discussion at the bedside with the wife to explain my exam and the possibility of anoxic brain injury and poor outcome.  She is very cognizant of damage that can occur to the brain in the setting of cardiac arrest and hypoperfusion, and is aware that he may not have a good outcome.  For now we will continue full support for 72 hours before formal prognostication. Plan MRI 01/15/2024 Plan was also discussed with intensivist Dr. Gretta at the bedside ______________________________________________________________________   Signed, Eligio Lav, MD [facility]   CRITICAL CARE ATTESTATION Performed by: Eligio Lav, MD Total critical care time: 30 minutes Critical care time was exclusive of separately billable procedures and  treating other patients and/or supervising APPs/Residents/Students Critical care was necessary to treat or prevent imminent or life-threatening deterioration. This patient is critically ill and at significant risk for neurological worsening and/or death and care requires constant monitoring. Critical care was time spent personally by me on the following activities: development of treatment plan with patient and/or surrogate as well as nursing, discussions with consultants, evaluation of patient's response to treatment, examination of patient, obtaining history from patient or surrogate, ordering and performing treatments and interventions, ordering and review of laboratory studies, ordering and review of radiographic studies, pulse oximetry, re-evaluation of patient's condition, participation in multidisciplinary rounds and medical decision making of high complexity in the care of this patient.

## 2024-01-14 NOTE — Progress Notes (Signed)
 NAME:  Thomas Stephenson, MRN:  [id number], DOB:  Jan 23, 1981, LOS: 3 ADMISSION DATE:  01/11/2024, CONSULTATION DATE:  01/11/24 REFERRING MD:  EDP, CHIEF COMPLAINT:  cardiac arrest   History of Present Illness:  43 yo male presented after sudden collapse, cardiac arrest and bystander initiated cpr. Pt has unknown overall down time, when EMS arrived they performed acls x63mins with rosc. All history is obtained from chart review as pt is intubated and unresponsive.  He is currently on 4mcg epi. CTH/neck pending. EKG with rbbb (no old comparisons) cardiology consulted and will see, no STMI activation but would like heparin infusion should cth result allow.   Pertinent  Medical History  T2DM, HLD   Significant Hospital Events: Including procedures, antibiotic start and stop dates in addition to other pertinent events   dmitted to ICU 10/27   Interim History / Subjective:  No change in mental status  Objective    Blood pressure 131/75, pulse 81, temperature 100 F (37.8 C), resp. rate 14, height 5' 9 (1.753 m), weight 73.3 kg, SpO2 98%.    Vent Mode: PRVC FiO2 (%):  [30 %] 30 % Set Rate:  [15 bmp] 15 bmp Vt Set:  [570 mL] 570 mL PEEP:  [5 cmH20] 5 cmH20 Plateau Pressure:  [15 cmH20-19 cmH20] 19 cmH20   Intake/Output Summary (Last 24 hours) at 01/14/2024 0909 Last data filed at 01/14/2024 0600 Gross per 24 hour  Intake 1079.34 ml  Output 1950 ml  Net -870.66 ml   Filed Weights   01/12/24 0312 01/13/24 0500 01/14/24 0500  Weight: 72.8 kg 76.3 kg 73.3 kg    Examination: General: intubated, non-responsive, sedated HENT: anicteric sclera,  Lungs: CTAB, vent breath sounds Cardiovascular: RRR, no mrg Neuro: no spontaneous movement  Resolved problem list   Assessment and Plan   OOHCA s/p rosc Acute coronary syndrome  Prolonged QT Cardiology following. Unclear etiology. No CTA for PE assessment but no significant RV dysfunction/dilation on TTE. Normal LV function without wall  motion abnormality. Ischemic work-up pending clinical improvement in brain function, for now 48 hours Heparin, ASA, Statin, tele monitoring. Pepcid d/c 10/29 for prolonged QT, avoid other prolonging agents, calcium repleted yesterday and wnl today.   Anoxic encephalopathy Myoclonic jerks Neurology following, appreciate recommendations. Continues on LT EEG with limited brain activity. No evidence of hard brainstem function on exam. For now will continue Keppra, Depakote. - Initiate hypothermia protocol with Tylenol, Arctic sun pads - Buspirone for shivering - MRI potentially tomorrow to assess anoxic brain injury   Acute hypoxic resp failure req mechanical ventilation Not ready to wean due to above. Empiric Unasyn given concern for aspiration. -LTVV -VAP prevention protocol -PAD protocol -TF  Hypophosphatemia  Repleted  Hyperglycemia with T2DM Above goal so will add Semglee 5 units daily, Novolog 4 units q 4 in addition to SSI   Transaminitis, POA Likely shock liver. Trend.  Labs   CBC: Recent Labs  Lab 01/11/24 1951 01/11/24 1953 01/11/24 2103 01/12/24 0251 01/12/24 1105 01/12/24 2349 01/14/24 0244  WBC 10.5  --   --  16.7* 13.8* 14.3* 9.5  NEUTROABS 7.1  --   --   --   --   --   --   HGB 15.3  15.6   < > 15.6 15.1 13.4 15.3 12.5*  HCT 45.3  46.0   < > 46.0 43.1 40.3 45.1 36.5*  MCV 87.6  --   --  85.7 87.6 85.9 87.1  PLT 239  --   --  232 186 170 175   < > = values in this interval not displayed.    Basic Metabolic Panel: Recent Labs  Lab 01/11/24 1951 01/11/24 1953 01/11/24 2103 01/11/24 2330 01/12/24 0251 01/12/24 2332 01/13/24 1050 01/14/24 0244  NA 135  137   < > 138  --  136 139 140 144  K 3.8  3.9   < > 3.2*  --  4.6 3.9 3.6 3.6  CL 101  104  --   --   --  103 105 111 110  CO2 15*  --   --   --  20* 21* 22 23  GLUCOSE 340*  348*  --   --   --  328* 194* 154* 201*  BUN 16  19  --   --   --  20 13 12 17   CREATININE 1.45*  1.20  --   --   1.34* 1.16 0.83 0.79 0.93  CALCIUM 8.5*  --   --   --  8.3* 8.3* 8.1* 8.9  MG 2.2  --   --   --  1.7 2.5* 2.4 2.3  PHOS  --   --   --   --  2.3* 2.7 3.0 2.1*   < > = values in this interval not displayed.   GFR: Estimated Creatinine Clearance: 100.3 mL/min (by C-G formula based on SCr of 0.93 mg/dL). Recent Labs  Lab 01/11/24 2340 01/12/24 0202 01/12/24 0251 01/12/24 1105 01/12/24 2349 01/13/24 1821 01/14/24 0244  WBC  --   --  16.7* 13.8* 14.3*  --  9.5  LATICACIDVEN 6.2* 6.0*  --   --   --  2.5*  --     Liver Function Tests: Recent Labs  Lab 01/11/24 1951 01/14/24 0244  AST 237* 91*  ALT 332* 105*  ALKPHOS 53 35*  BILITOT 0.9 0.7  PROT 6.0* 5.2*  ALBUMIN 3.7 2.7*   No results for input(s): LIPASE, AMYLASE in the last 168 hours. No results for input(s): AMMONIA in the last 168 hours.  ABG    Component Value Date/Time   PHART 7.261 (L) 01/11/2024 2103   PCO2ART 36.1 01/11/2024 2103   PO2ART 599 (H) 01/11/2024 2103   HCO3 16.6 (L) 01/11/2024 2103   TCO2 18 (L) 01/11/2024 2103   ACIDBASEDEF 10.0 (H) 01/11/2024 2103   O2SAT 100 01/11/2024 2103     Coagulation Profile: Recent Labs  Lab 01/11/24 1951  INR 1.1    Cardiac Enzymes: No results for input(s): CKTOTAL, CKMB, CKMBINDEX, TROPONINI in the last 168 hours.  HbA1C: Hgb A1c MFr Bld  Date/Time Value Ref Range Status  01/14/2024 02:44 AM 6.1 (H) 4.8 - 5.6 % Final    Comment:    (NOTE) Diagnosis of Diabetes The following HbA1c ranges recommended by the American Diabetes Association (ADA) may be used as an aid in the diagnosis of diabetes mellitus.  Hemoglobin             Suggested A1C NGSP%              Diagnosis  <5.7                   Non Diabetic  5.7-6.4                Pre-Diabetic  >6.4                   Diabetic  <7.0  Glycemic control for                       adults with diabetes.    11/04/2023 02:24 PM 6.2 (H) <5.7 % Final    Comment:    For someone  without known diabetes, a hemoglobin  A1c value between 5.7% and 6.4% is consistent with prediabetes and should be confirmed with a  follow-up test. . For someone with known diabetes, a value <7% indicates that their diabetes is well controlled. A1c targets should be individualized based on duration of diabetes, age, comorbid conditions, and other considerations. . This assay result is consistent with an increased risk of diabetes. . Currently, no consensus exists regarding use of hemoglobin A1c for diagnosis of diabetes for children. .     CBG: Recent Labs  Lab 01/13/24 1533 01/13/24 1931 01/13/24 2309 01/14/24 0320 01/14/24 0752  GLUCAP 190* 267* 244* 205* 235*    Review of Systems:     Past Medical History:  He,  has a past medical history of Diabetes mellitus without complication (HCC) and Hypertension.   Surgical History:  No past surgical history on file.   Social History:   reports that he has never smoked. He has never used smokeless tobacco. He reports current alcohol use. He reports that he does not use drugs.   Family History:  His family history includes Alcohol abuse in his father; Depression in his father; Drug abuse in his father; Schizophrenia in his father; Thyroid  disease in his mother. There is no history of Colon cancer or Stomach cancer.   Allergies Allergies  Allergen Reactions   Mushroom Extract Complex (Obsolete)     Unknown reaction     Home Medications  Prior to Admission medications   Medication Sig Start Date End Date Taking? Authorizing Provider  Continuous Glucose Sensor (DEXCOM G7 SENSOR) MISC Every 10 days 06/30/23  Yes Pender, Julie F, FNP  glipiZIDE  (GLUCOTROL ) 10 MG tablet Take 1 tablet (10 mg total) by mouth 2 (two) times daily before a meal. 11/04/23  Yes Gareth Mliss FALCON, FNP  insulin  glargine, 2 Unit Dial, (TOUJEO  MAX SOLOSTAR) 300 UNIT/ML Solostar Pen Inject 16 Units into the skin daily at 6 (six) AM. 11/04/23  Yes Gareth Mliss FALCON, FNP  lisinopril  (ZESTRIL ) 5 MG tablet Take 1 tablet (5 mg total) by mouth daily. 11/04/23  Yes Gareth Mliss FALCON, FNP  saxagliptin  HCl (ONGLYZA) 5 MG TABS tablet TAKE 1 TABLET BY MOUTH DAILY 10/19/23  Yes Pender, Julie F, FNP  BD PEN NEEDLE MICRO ULTRAFINE 32G X 6 MM MISC 1 EACH BY DOES NOT APPLY ROUTE DAILY AT 6 (SIX) AM. 08/05/23   Gareth Mliss FALCON, FNP  Glucagon , rDNA, (GLUCAGON  EMERGENCY) 1 MG KIT Inject 1 mg into the muscle as needed (hypoglycemia). 10/06/23   Pender, Julie F, FNP     Feleica Fulmore, DO

## 2024-01-14 NOTE — Procedures (Signed)
 Patient Name: Thomas Stephenson  MRN: [id number]  Epilepsy Attending: Arlin MALVA Krebs  Referring Physician/Provider: Jude Harden GAILS, MD  Duration: 01/13/2024 1125 to 01/14/2024 1125   Patient history: 43yo m s/p cardiac arrest. EEG to evaluate for seizure   Level of alertness: comatose   AEDs during EEG study: Propofol, VPA, LEV   Technical aspects: This EEG study was done with scalp electrodes positioned according to the 10-20 International system of electrode placement. Electrical activity was reviewed with band pass filter of 1-70Hz , sensitivity of 7 uV/mm, display speed of 62mm/sec with a 60Hz  notched filter applied as appropriate. EEG data were recorded continuously and digitally stored.  Video monitoring was available and reviewed as appropriate.   Description: EEG initially showed generalized periodic epileptiform discharges at 1.5 to 2.5Hz  admixed with 2-3hz  delta slowing.  Gradually as sedation was resumed, EEG showed continuous generalized polymorphic mixed frequencies with predominantly 3 to 6 Hz theta-delta slowing admixed with 13-15 Hz beta activity. Intermittently, generalized sharp waves were also noted.  Hyperventilation and photic stimulation were not performed.      ABNORMALITY - Continuous slow, generalized - Periodic epileptiform discharges, generalized   IMPRESSION: At the beginning of the study, EEG showed evidence of epileptogenicity with generalized onset.  This EEG pattern is on the ictal-interictal continuum. Given the frequency of discharges, high suspicion for ictal nature.  As sedation was resumed, epileptiform discharges resolved.  Subsequently EEG was suggestive of generalized cerebral dysfunction (encephalopathy).  Dr Voncile was notified.   Prue Lingenfelter O Sheika Coutts

## 2024-01-14 NOTE — Inpatient Diabetes Management (Signed)
 Inpatient Diabetes Program Recommendations  AACE/ADA: New Consensus Statement on Inpatient Glycemic Control (2015)  Target Ranges:  Prepandial:   less than 140 mg/dL      Peak postprandial:   less than 180 mg/dL (1-2 hours)      Critically ill patients:  140 - 180 mg/dL   Lab Results  Component Value Date   GLUCAP 255 (H) 01/14/2024   HGBA1C 6.1 (H) 01/14/2024    Review of Glycemic Control  Diabetes history: DM2 Outpatient Diabetes medications: glipizide  10 mg bid, Toujeo  16 units Daily, Onlyza 5 mg Daily  Current orders for Inpatient glycemic control: Novolog 0-15 units Q4H, Novolog 2 units Q4H, Vital @ 20/hr with a goal of 50/hr  Inpatient Diabetes Program Recommendations:    Might consider:  Semglee 10 units every day  Thank you, Wyvonna Pinal, MSN, CDCES Diabetes Coordinator Inpatient Diabetes Program [phone] (team pager from 8a-5p)

## 2024-01-14 NOTE — Progress Notes (Signed)
 eLink Physician-Brief Progress Note Patient Name: Thomas Stephenson DOB: 29-Aug-1980 MRN: [id number]   Date of Service  01/14/2024  HPI/Events of Note  Patient with elevated temperature despite Tylenol PRN.  eICU Interventions  IV  / enteral Ibuprofen + cooling blanket.        Loyce Flaming U Varonica Siharath 01/14/2024, 9:36 PM

## 2024-01-15 ENCOUNTER — Inpatient Hospital Stay (HOSPITAL_COMMUNITY)

## 2024-01-15 ENCOUNTER — Other Ambulatory Visit: Payer: Self-pay

## 2024-01-15 DIAGNOSIS — E870 Hyperosmolality and hypernatremia: Secondary | ICD-10-CM

## 2024-01-15 DIAGNOSIS — I469 Cardiac arrest, cause unspecified: Secondary | ICD-10-CM | POA: Diagnosis not present

## 2024-01-15 DIAGNOSIS — J9601 Acute respiratory failure with hypoxia: Secondary | ICD-10-CM | POA: Diagnosis not present

## 2024-01-15 DIAGNOSIS — G931 Anoxic brain damage, not elsewhere classified: Secondary | ICD-10-CM | POA: Diagnosis not present

## 2024-01-15 DIAGNOSIS — R569 Unspecified convulsions: Secondary | ICD-10-CM | POA: Diagnosis not present

## 2024-01-15 DIAGNOSIS — E1165 Type 2 diabetes mellitus with hyperglycemia: Secondary | ICD-10-CM | POA: Diagnosis not present

## 2024-01-15 LAB — CBC
HCT: 37.9 % — ABNORMAL LOW (ref 39.0–52.0)
Hemoglobin: 12.5 g/dL — ABNORMAL LOW (ref 13.0–17.0)
MCH: 29.5 pg (ref 26.0–34.0)
MCHC: 33 g/dL (ref 30.0–36.0)
MCV: 89.4 fL (ref 80.0–100.0)
Platelets: 160 K/uL (ref 150–400)
RBC: 4.24 MIL/uL (ref 4.22–5.81)
RDW: 12.9 % (ref 11.5–15.5)
WBC: 7.9 K/uL (ref 4.0–10.5)
nRBC: 0 % (ref 0.0–0.2)

## 2024-01-15 LAB — COMPREHENSIVE METABOLIC PANEL WITH GFR
ALT: 79 U/L — ABNORMAL HIGH (ref 0–44)
AST: 95 U/L — ABNORMAL HIGH (ref 15–41)
Albumin: 2.8 g/dL — ABNORMAL LOW (ref 3.5–5.0)
Alkaline Phosphatase: 35 U/L — ABNORMAL LOW (ref 38–126)
Anion gap: 13 (ref 5–15)
BUN: 20 mg/dL (ref 6–20)
CO2: 23 mmol/L (ref 22–32)
Calcium: 8.6 mg/dL — ABNORMAL LOW (ref 8.9–10.3)
Chloride: 112 mmol/L — ABNORMAL HIGH (ref 98–111)
Creatinine, Ser: 0.94 mg/dL (ref 0.61–1.24)
GFR, Estimated: >60 mL/min (ref >60)
Glucose, Bld: 235 mg/dL — ABNORMAL HIGH (ref 70–99)
Potassium: 3.8 mmol/L (ref 3.5–5.1)
Sodium: 148 mmol/L — ABNORMAL HIGH (ref 135–145)
Total Bilirubin: 0.7 mg/dL (ref 0.0–1.2)
Total Protein: 5.4 g/dL — ABNORMAL LOW (ref 6.5–8.1)

## 2024-01-15 LAB — GLUCOSE, CAPILLARY
Glucose-Capillary: 236 mg/dL — ABNORMAL HIGH (ref 70–99)
Glucose-Capillary: 247 mg/dL — ABNORMAL HIGH (ref 70–99)
Glucose-Capillary: 278 mg/dL — ABNORMAL HIGH (ref 70–99)
Glucose-Capillary: 251 mg/dL — ABNORMAL HIGH (ref 70–99)
Glucose-Capillary: 273 mg/dL — ABNORMAL HIGH (ref 70–99)

## 2024-01-15 LAB — PHOSPHORUS: Phosphorus: 3.4 mg/dL (ref 2.5–4.6)

## 2024-01-15 LAB — MAGNESIUM: Magnesium: 2 mg/dL (ref 1.7–2.4)

## 2024-01-15 MED ORDER — MORPHINE BOLUS VIA INFUSION
1.0000 mg | INTRAVENOUS | Status: DC | PRN
  Administered 2024-01-16 (×2): 4 mg via INTRAVENOUS

## 2024-01-15 MED ORDER — LORAZEPAM 2 MG/ML IJ SOLN
1.0000 mg | INTRAMUSCULAR | Status: DC | PRN
  Administered 2024-01-16 (×8): 2 mg via INTRAVENOUS
  Filled 2024-01-15 (×8): qty 1

## 2024-01-15 MED ORDER — HALOPERIDOL 0.5 MG PO TABS: 0.5000 mg | ORAL_TABLET | ORAL | Status: DC | PRN

## 2024-01-15 MED ORDER — HALOPERIDOL LACTATE 5 MG/ML IJ SOLN: 0.5000 mg | INTRAMUSCULAR | Status: DC | PRN

## 2024-01-15 MED ORDER — ACETAMINOPHEN 325 MG PO TABS: 650.0000 mg | ORAL_TABLET | Freq: Four times a day (QID) | ORAL | Status: DC | PRN

## 2024-01-15 MED ORDER — POLYVINYL ALCOHOL 1.4 % OP SOLN: 1.0000 [drp] | Freq: Four times a day (QID) | OPHTHALMIC | Status: DC | PRN

## 2024-01-15 MED ORDER — ONDANSETRON HCL 4 MG/2ML IJ SOLN: 4.0000 mg | Freq: Four times a day (QID) | INTRAMUSCULAR | Status: DC | PRN

## 2024-01-15 MED ORDER — MORPHINE 100MG IN NS 100ML (1MG/ML) PREMIX INFUSION
1.0000 mg/h | INTRAVENOUS | Status: DC
  Administered 2024-01-16: 5 mg/h via INTRAVENOUS
  Filled 2024-01-15: qty 100

## 2024-01-15 MED ORDER — GLYCOPYRROLATE 0.2 MG/ML IJ SOLN
0.2000 mg | INTRAMUSCULAR | Status: DC | PRN
  Administered 2024-01-16 (×3): 0.2 mg via INTRAVENOUS
  Filled 2024-01-15 (×2): qty 1

## 2024-01-15 MED ORDER — GLYCOPYRROLATE 1 MG PO TABS: 1.0000 mg | ORAL_TABLET | ORAL | Status: DC | PRN

## 2024-01-15 MED ORDER — ONDANSETRON 4 MG PO TBDP: 4.0000 mg | ORAL_TABLET | Freq: Four times a day (QID) | ORAL | Status: DC | PRN

## 2024-01-15 MED ORDER — INSULIN GLARGINE-YFGN 100 UNIT/ML ~~LOC~~ SOLN
20.0000 [IU] | Freq: Two times a day (BID) | SUBCUTANEOUS | Status: DC
  Administered 2024-01-15: 20 [IU] via SUBCUTANEOUS
  Filled 2024-01-15 (×3): qty 0.2

## 2024-01-15 MED ORDER — FREE WATER
200.0000 mL | Status: DC
  Administered 2024-01-15 (×5): 200 mL

## 2024-01-15 MED ORDER — BIOTENE DRY MOUTH MT LIQD: 15.0000 mL | OROMUCOSAL | Status: DC | PRN

## 2024-01-15 MED ORDER — HALOPERIDOL LACTATE 2 MG/ML PO CONC: 0.5000 mg | ORAL | Status: DC | PRN

## 2024-01-15 MED ORDER — SODIUM CHLORIDE 0.9 % IV SOLN: 250.0000 mL | INTRAVENOUS | Status: DC | PRN

## 2024-01-15 MED ORDER — SODIUM CHLORIDE 0.9% FLUSH
3.0000 mL | Freq: Two times a day (BID) | INTRAVENOUS | Status: DC
  Administered 2024-01-15: 3 mL via INTRAVENOUS

## 2024-01-15 MED ORDER — ACETAMINOPHEN 650 MG RE SUPP: 650.0000 mg | Freq: Four times a day (QID) | RECTAL | Status: DC | PRN

## 2024-01-15 MED ORDER — GLYCOPYRROLATE 0.2 MG/ML IJ SOLN
0.2000 mg | INTRAMUSCULAR | Status: DC | PRN
  Filled 2024-01-15: qty 1

## 2024-01-15 MED ORDER — SODIUM CHLORIDE 0.9% FLUSH: 3.0000 mL | INTRAVENOUS | Status: DC | PRN

## 2024-01-15 NOTE — Progress Notes (Signed)
 eLink Physician-Brief Progress Note Patient Name: Thomas Stephenson DOB: 1981-02-17 MRN: [id number]   Date of Service  01/15/2024  HPI/Events of Note  Patient's spouse would like to transition to comfort care and have him terminally extubated. I had a long conversation with his wife during which I verified her intent then explained to her what to expect. She understands that myoclonic jerking may occur of Propofol gtt but we will do our best to suppress it with PRN Versed.She declined  the services of a chaplain.  eICU Interventions  Extubation and comfort measures orders entered.        Anden Bartolo U Chenelle Benning 01/15/2024, 11:06 PM

## 2024-01-15 NOTE — Plan of Care (Signed)
  Problem: Activity: Goal: Ability to tolerate increased activity will improve Outcome: Progressing   Problem: Respiratory: Goal: Ability to maintain a clear airway and adequate ventilation will improve Outcome: Progressing   Problem: Role Relationship: Goal: Method of communication will improve Outcome: Progressing   Problem: Coping: Goal: Ability to adjust to condition or change in health will improve Outcome: Progressing   Problem: Fluid Volume: Goal: Ability to maintain a balanced intake and output will improve Outcome: Progressing   Problem: Metabolic: Goal: Ability to maintain appropriate glucose levels will improve Outcome: Progressing   Problem: Skin Integrity: Goal: Risk for impaired skin integrity will decrease Outcome: Progressing   Problem: Tissue Perfusion: Goal: Adequacy of tissue perfusion will improve Outcome: Progressing   Problem: Health Behavior/Discharge Planning: Goal: Ability to manage health-related needs will improve Outcome: Progressing   Problem: Activity: Goal: Risk for activity intolerance will decrease Outcome: Progressing   Problem: Pain Managment: Goal: General experience of comfort will improve and/or be controlled Outcome: Progressing   Problem: Safety: Goal: Ability to remain free from injury will improve Outcome: Progressing   Problem: Skin Integrity: Goal: Risk for impaired skin integrity will decrease Outcome: Progressing

## 2024-01-15 NOTE — IPAL (Signed)
  Interdisciplinary Goals of Care Family Meeting   Date carried out:: 01/15/2024  Location of the meeting: Bedside  Member's involved: Physician and Family Member or next of kin  Durable Power of Attorney or environmental health practitioner: wife, aunt    Discussion: We discussed goals of care for Thomas Stephenson .  Thomas Stephenson's CT scan shows diffuse anoxic injury-- d/w Neurology who agree that his prognosis for a meaningful recovery is very low. His wife and aunt are very sure he would not want to be dependent on machines or long-term total care. They are interested in organ donation, and if this is not possible, then would want comfort care later today. We discussed what this would look like and I have deferred any discussion about organ donation to the [facility] team.  Code status: Full DNR- wife requested this to RN just after our discussion about wanting comfort care if he could not donate organs.   Disposition: In-patient comfort care; ongoing discussions with HB   Time spent for the meeting: 15 min.  Thomas Stephenson 01/15/2024, 5:36 PM

## 2024-01-15 NOTE — Progress Notes (Addendum)
 NEUROLOGY CONSULT FOLLOW UP NOTE   Date of service: January 15, 2024 Patient Name: Thomas Stephenson MRN:  [id number] DOB:  1980-06-04  Interval Hx/subjective  Seen and examined On propofol-sedation held for exam Family reports that whenever propofol is taken off or when he is being cared for and stimulated in any way, he has myoclonic jerks and shaking involving his arms. They were also concerned about him spiking fevers for which he is being on cooling blankets and is getting Tylenol  Vitals   Vitals:   01/15/24 0328 01/15/24 0330 01/15/24 0400 01/15/24 0430  BP:  (!) 173/84 (!) 158/83 (!) 148/81  Pulse: (!) 102 100 96 94  Resp:  18 16 15   Temp:  99.5 F (37.5 C) 99.5 F (37.5 C) 99.5 F (37.5 C)  TempSrc:      SpO2:  98% 98% 96%  Weight:      Height:         Body mass index is 23.86 kg/m.  Physical Exam  General: Well-developed well-nourished man, sedated, intubated. HEENT: Normocephalic/atraumatic Lungs: Vented Cardiovascular: Regular rhythm Neurological exam Sedation with propofol held for exam He is intubated No spontaneous movement Pupils sluggishly reactive.  Gaze midline. Initially no twitching noted but upon holding propofol for some time, he started having some shaking in his left arm and had rhythmic eyelid twitching. Corneal reflexes are absent Absent gag Cough reflex is present To noxious stimulation, extension in both upper extremities, no response in lower extremities  Medications  Current Facility-Administered Medications:    acetaminophen (TYLENOL) 160 MG/5ML solution 650 mg, 650 mg, Per Tube, Q4H PRN, Alva, Rakesh V, MD   acetaminophen (TYLENOL) tablet 650 mg, 650 mg, Per Tube, Q4H, 650 mg at 01/15/24 0938 **OR** acetaminophen (TYLENOL) 160 MG/5ML solution 650 mg, 650 mg, Per Tube, Q4H, 650 mg at 01/15/24 0434 **OR** acetaminophen (TYLENOL) suppository 650 mg, 650 mg, Rectal, Q4H, Alva, Rakesh V, MD   Ampicillin-Sulbactam (UNASYN) 3 g in sodium  chloride 0.9 % 100 mL IVPB, 3 g, Intravenous, Q6H, Gaines Carrier, RPH, Stopped at 01/15/24 9376   aspirin chewable tablet 81 mg, 81 mg, Per Tube, Daily, Ogan, Okoronkwo U, MD, 81 mg at 01/15/24 0945   atorvastatin (LIPITOR) tablet 40 mg, 40 mg, Per Tube, Daily, Gretta Doffing P, DO, 40 mg at 01/15/24 0945   busPIRone (BUSPAR) tablet 30 mg, 30 mg, Per Tube, Q8H, 30 mg at 01/14/24 0333 **OR** busPIRone (BUSPAR) tablet 30 mg, 30 mg, Per Tube, Q8H, Alva, Rakesh V, MD, 30 mg at 01/15/24 0434   Chlorhexidine Gluconate Cloth 2 % PADS 6 each, 6 each, Topical, Daily, Layman Raisin, DO, 6 each at 01/14/24 2205   docusate (COLACE) 50 MG/5ML liquid 100 mg, 100 mg, Per Tube, BID PRN, Layman Raisin, DO   feeding supplement (PROSource TF20) liquid 60 mL, 60 mL, Per Tube, Daily, Gretta Doffing P, DO, 60 mL at 01/15/24 0945   feeding supplement (VITAL 1.5 CAL) liquid 1,000 mL, 1,000 mL, Per Tube, Continuous, Gretta Doffing SQUIBB, DO, Last Rate: 50 mL/hr at 01/15/24 0900, Infusion Verify at 01/15/24 0900   fentaNYL (SUBLIMAZE) injection 25-100 mcg, 25-100 mcg, Intravenous, Q2H PRN, Alva, Rakesh V, MD, 100 mcg at 01/14/24 1926   free water 200 mL, 200 mL, Per Tube, Q4H, Gretta Doffing P, DO, 200 mL at 01/15/24 0804   ibuprofen (ADVIL) 100 MG/5ML suspension 600 mg, 600 mg, Per Tube, Q6H PRN, Ogan, Okoronkwo U, MD   insulin  aspart (novoLOG) injection 0-15 Units, 0-15 Units, Subcutaneous,  Q4H, Marshall, Jessica, DO, 5 Units at 01/15/24 0801   insulin  aspart (novoLOG) injection 4 Units, 4 Units, Subcutaneous, Q4H, Gretta Leita SQUIBB, DO, 4 Units at 01/15/24 0801   insulin  glargine-yfgn (SEMGLEE) injection 8 Units, 8 Units, Subcutaneous, BID, Gretta Leita SQUIBB, DO, 8 Units at 01/15/24 0945   levETIRAcetam (KEPPRA) undiluted injection 500 mg, 500 mg, Intravenous, Q12H, Jostin Rue, MD, 500 mg at 01/15/24 0945   metoprolol tartrate (LOPRESSOR) injection 2.5 mg, 2.5 mg, Intravenous, Q4H PRN, Ogan, Okoronkwo U, MD, 2.5 mg at 01/13/24  0350   midazolam PF (VERSED) injection 2 mg, 2 mg, Intravenous, Q15 min PRN, Alva, Rakesh V, MD   midazolam PF (VERSED) injection 2 mg, 2 mg, Intravenous, Q2H PRN, Alva, Rakesh V, MD, 2 mg at 01/14/24 2147   norepinephrine (LEVOPHED) 4mg  in (0.016 mg/mL) premix infusion, 0-10 mcg/min, Intravenous, Titrated, Jude Harden GAILS, MD, Held at 01/12/24 1025   Oral care mouth rinse, 15 mL, Mouth Rinse, Q2H, Layman Raisin, DO, 15 mL at 01/15/24 0802   Oral care mouth rinse, 15 mL, Mouth Rinse, PRN, Layman Raisin, DO   pantoprazole (PROTONIX) injection 40 mg, 40 mg, Intravenous, Q24H, Gretta Leita SQUIBB, DO, 40 mg at 01/14/24 1929   polyethylene glycol (MIRALAX / GLYCOLAX) packet 17 g, 17 g, Per Tube, Daily PRN, Layman Raisin, DO   propofol (DIPRIVAN) 1000 MG/100ML infusion, 0-100 mcg/kg/min, Intravenous, Continuous, Shafer, Devon, NP, Last Rate: 17.47 mL/hr at 01/15/24 0900, 40 mcg/kg/min at 01/15/24 0900   senna (SENOKOT) tablet 8.6 mg, 1 tablet, Per Tube, Daily, Gretta Leita P, DO, 8.6 mg at 01/15/24 0945   valproate (DEPACON) 500 mg in dextrose 5 % 50 mL IVPB, 500 mg, Intravenous, Q12H, Shafer, Jorene, NP, Last Rate: 55 mL/hr at 01/15/24 0954, 500 mg at 01/15/24 0954  Labs and Diagnostic Imaging   CBC:  Recent Labs  Lab 01/11/24 1951 01/11/24 1953 01/14/24 0244 01/15/24 0237  WBC 10.5   < > 9.5 7.9  NEUTROABS 7.1  --   --   --   HGB 15.3  15.6   < > 12.5* 12.5*  HCT 45.3  46.0   < > 36.5* 37.9*  MCV 87.6   < > 87.1 89.4  PLT 239   < > 175 160   < > = values in this interval not displayed.    Basic Metabolic Panel:  Lab Results  Component Value Date   NA 148 (H) 01/15/2024   K 3.8 01/15/2024   CO2 23 01/15/2024   GLUCOSE 235 (H) 01/15/2024   BUN 20 01/15/2024   CREATININE 0.94 01/15/2024   CALCIUM 8.6 (L) 01/15/2024   GFRNONAA >60 01/15/2024   Lipid Panel:  Lab Results  Component Value Date   LDLCALC 116 (H) 01/11/2024   HgbA1c:  Lab Results  Component Value  Date   HGBA1C 6.1 (H) 01/14/2024   Urine Drug Screen:     Component Value Date/Time   LABOPIA NONE DETECTED 01/11/2024 1958   COCAINSCRNUR NONE DETECTED 01/11/2024 1958   LABBENZ POSITIVE (A) 01/11/2024 1958   AMPHETMU NONE DETECTED 01/11/2024 1958   THCU NONE DETECTED 01/11/2024 1958   LABBARB NONE DETECTED 01/11/2024 1958    Alcohol Level     Component Value Date/Time   [facility] <15 01/11/2024 1951   INR  Lab Results  Component Value Date   INR 1.1 01/11/2024   APTT  Lab Results  Component Value Date   APTT 28 01/11/2024   Arrival CT Head without contrast(Personally  reviewed): No acute findings  Last night's overnight EEG with periodic epileptiform discharges that are generalized and continuous slowing that is also generalized.  Assessment   Thomas Stephenson is a 44 y.o. male with history of diabetes and hypertension who had a out-of-hospital cardiac arrest with unknown downtime prior to EMS arrival and then about 18 minutes of CPR by EMS prior to ROSC, was started exhibiting generalized myoclonic twitching.  Was loaded with Keppra, Depakote was added after 20 mg/kg load. He remains on propofol with intermittent breakthrough twitching even on propofol His EEG showed epileptiform bursts which showed some improvement with sedation. Currently EEG shows GPD's and generalized continuous slowing Examination remains poor with some brainstem reflexes present but no evidence of higher cortical function.  Impression: Concern for anoxic brain injury status postcardiac arrest  Recommendations  Continue current supportive treatment Continue Keppra Continue Depakote MRI brain without contrast  Plan was discussed with his wife, and discussed with intensivist Dr. Gretta   Addendum 5:30 PM MRI brain-formal read pending.  Personally reviewed.  Consistent with anoxic brain injury.  Discussed with Dr. Gretta who will be discussing possible withdrawal of care and comfort measures as well as  possible organ donation with the family. Inpatient neurology will be available as needed.  Please call with questions. ______________________________________________________________________   Bonney Eligio Lav, MD [facility]   CRITICAL CARE ATTESTATION Performed by: Eligio Lav, MD Total critical care time: 39 minutes Critical care time was exclusive of separately billable procedures and treating other patients and/or supervising APPs/Residents/Students Critical care was necessary to treat or prevent imminent or life-threatening deterioration. This patient is critically ill and at significant risk for neurological worsening and/or death and care requires constant monitoring. Critical care was time spent personally by me on the following activities: development of treatment plan with patient and/or surrogate as well as nursing, discussions with consultants, evaluation of patient's response to treatment, examination of patient, obtaining history from patient or surrogate, ordering and performing treatments and interventions, ordering and review of laboratory studies, ordering and review of radiographic studies, pulse oximetry, re-evaluation of patient's condition, participation in multidisciplinary rounds and medical decision making of high complexity in the care of this patient.

## 2024-01-15 NOTE — Procedures (Addendum)
 Patient Name: Thomas Stephenson  MRN: [id number]  Epilepsy Attending: Arlin MALVA Krebs  Referring Physician/Provider: Jude Harden GAILS, MD  Duration: 01/14/2024 1125 to 01/15/2024 1125   Patient history: 43yo m s/p cardiac arrest. EEG to evaluate for seizure   Level of alertness: comatose   AEDs during EEG study: Propofol, VPA, LEV   Technical aspects: This EEG study was done with scalp electrodes positioned according to the 10-20 International system of electrode placement. Electrical activity was reviewed with band pass filter of 1-70Hz , sensitivity of 7 uV/mm, display speed of 74mm/sec with a 60Hz  notched filter applied as appropriate. EEG data were recorded continuously and digitally stored.  Video monitoring was available and reviewed as appropriate.   Description: EEG showed generalized periodic epileptiform discharges at 0.25-1Hz  as well as continuous generalized polymorphic mixed frequencies with predominantly 3 to 6 Hz theta-delta slowing admixed with 13-15 Hz beta activity.  Hyperventilation and photic stimulation were not performed.      ABNORMALITY - Periodic epileptiform discharges, generalized  - Continuous slow, generalized   IMPRESSION: This study showed evidence of epileptogenicity with generalized onset as well as generalized cerebral dysfunction (encephalopathy). No definite seizures were noted.    Mirella Gueye O Purcell Jungbluth

## 2024-01-15 NOTE — Progress Notes (Signed)
 NAME:  Thomas Stephenson, MRN:  [id number], DOB:  Aug 10, 1980, LOS: 4 ADMISSION DATE:  01/11/2024, CONSULTATION DATE:  01/11/24 REFERRING MD:  EDP, CHIEF COMPLAINT:  cardiac arrest  History of Present Illness:  43 yo male presented after sudden collapse, cardiac arrest and bystander initiated cpr. Pt has unknown overall down time, when EMS arrived they performed acls x71mins with rosc. All history is obtained from chart review as pt is intubated and unresponsive.  He is currently on 4mcg epi. CTH/neck pending. EKG with rbbb (no old comparisons) cardiology consulted and will see, no STMI activation but would like heparin infusion should cth result allow.   Pertinent  Medical History  T2DM, HLD   Significant Hospital Events: Including procedures, antibiotic start and stop dates in addition to other pertinent events   Admitted to ICU 10/27   Interim History / Subjective:  No change in mental status   Objective    Blood pressure (!) 148/81, pulse 94, temperature 99.5 F (37.5 C), resp. rate 15, height 5' 9 (1.753 m), weight 73.3 kg, SpO2 96%.    Vent Mode: PRVC FiO2 (%):  [30 %] 30 % Set Rate:  [15 bmp] 15 bmp Vt Set:  [570 mL] 570 mL PEEP:  [5 cmH20] 5 cmH20 Plateau Pressure:  [11 cmH20-19 cmH20] 11 cmH20   Intake/Output Summary (Last 24 hours) at 01/15/2024 0823 Last data filed at 01/15/2024 0804 Gross per 24 hour  Intake 1550.24 ml  Output 800 ml  Net 750.24 ml   Filed Weights   01/12/24 0312 01/13/24 0500 01/14/24 0500  Weight: 72.8 kg 76.3 kg 73.3 kg    Examination: General: intubated, non-responsive, sedated HENT: anicteric sclera,  Lungs: CTAB, vent breath sounds Cardiovascular: RRR, no mrg Neuro: no spontaneous movement, PERRLA  Resolved problem list   Assessment and Plan  OOHCA s/p rosc Acute coronary syndrome  Prolonged QT Cardiology following. Unclear etiology. No CTA for PE assessment but no significant RV dysfunction/dilation on TTE. Normal LV function  without wall motion abnormality. Ischemic work-up pending clinical improvement in brain function, for now 48 hours Heparin (now d/c), ASA, Statin, tele monitoring. Pepcid d/c 10/29 for prolonged QT, avoid other prolonging agents, calcium repleted.   Anoxic encephalopathy Myoclonic jerks Fevers Neurology following, appreciate recommendations. Continues on LT EEG with limited brain activity. No evidence of hard brainstem function on exam. For now will continue Keppra, Depakote. - Initiate hypothermia protocol with Tylenol, Arctic sun pads, febrile overnight but artic sun pads were off (fevers better this morning) - Buspirone for shivering - MRI ordered, pending   Acute hypoxic resp failure req mechanical ventilation Not ready to wean due to above. Empiric Unasyn given concern for aspiration. -LTVV -VAP prevention protocol -PAD protocol -TF   Hyperglycemia with T2DM Above goal so will increase Semglee to 20 units BID, Novolog 4 units q 4 in addition to SSI   Transaminitis, POA Likely shock liver, stable.  Labs   CBC: Recent Labs  Lab 01/11/24 1951 01/11/24 1953 01/12/24 0251 01/12/24 1105 01/12/24 2349 01/14/24 0244 01/15/24 0237  WBC 10.5  --  16.7* 13.8* 14.3* 9.5 7.9  NEUTROABS 7.1  --   --   --   --   --   --   HGB 15.3  15.6   < > 15.1 13.4 15.3 12.5* 12.5*  HCT 45.3  46.0   < > 43.1 40.3 45.1 36.5* 37.9*  MCV 87.6  --  85.7 87.6 85.9 87.1 89.4  PLT 239  --  232 186 170 175 160   < > = values in this interval not displayed.    Basic Metabolic Panel: Recent Labs  Lab 01/12/24 0251 01/12/24 2332 01/13/24 1050 01/14/24 0244 01/15/24 0237  NA 136 139 140 144 148*  K 4.6 3.9 3.6 3.6 3.8  CL 103 105 111 110 112*  CO2 20* 21* 22 23 23   GLUCOSE 328* 194* 154* 201* 235*  BUN 20 13 12 17 20   CREATININE 1.16 0.83 0.79 0.93 0.94  CALCIUM 8.3* 8.3* 8.1* 8.9 8.6*  MG 1.7 2.5* 2.4 2.3 2.0  PHOS 2.3* 2.7 3.0 2.1* 3.4   GFR: Estimated Creatinine Clearance: 99.2  mL/min (by C-G formula based on SCr of 0.94 mg/dL). Recent Labs  Lab 01/11/24 2340 01/12/24 0202 01/12/24 0251 01/12/24 1105 01/12/24 2349 01/13/24 1821 01/14/24 0244 01/15/24 0237  WBC  --   --    < > 13.8* 14.3*  --  9.5 7.9  LATICACIDVEN 6.2* 6.0*  --   --   --  2.5*  --   --    < > = values in this interval not displayed.    Liver Function Tests: Recent Labs  Lab 01/11/24 1951 01/14/24 0244 01/15/24 0237  AST 237* 91* 95*  ALT 332* 105* 79*  ALKPHOS 53 35* 35*  BILITOT 0.9 0.7 0.7  PROT 6.0* 5.2* 5.4*  ALBUMIN 3.7 2.7* 2.8*   No results for input(s): LIPASE, AMYLASE in the last 168 hours. No results for input(s): AMMONIA in the last 168 hours.  ABG    Component Value Date/Time   PHART 7.261 (L) 01/11/2024 2103   PCO2ART 36.1 01/11/2024 2103   PO2ART 599 (H) 01/11/2024 2103   HCO3 16.6 (L) 01/11/2024 2103   TCO2 18 (L) 01/11/2024 2103   ACIDBASEDEF 10.0 (H) 01/11/2024 2103   O2SAT 100 01/11/2024 2103     Coagulation Profile: Recent Labs  Lab 01/11/24 1951  INR 1.1    Cardiac Enzymes: No results for input(s): CKTOTAL, CKMB, CKMBINDEX, TROPONINI in the last 168 hours.  HbA1C: Hgb A1c MFr Bld  Date/Time Value Ref Range Status  01/14/2024 02:44 AM 6.1 (H) 4.8 - 5.6 % Final    Comment:    (NOTE) Diagnosis of Diabetes The following HbA1c ranges recommended by the American Diabetes Association (ADA) may be used as an aid in the diagnosis of diabetes mellitus.  Hemoglobin             Suggested A1C NGSP%              Diagnosis  <5.7                   Non Diabetic  5.7-6.4                Pre-Diabetic  >6.4                   Diabetic  <7.0                   Glycemic control for                       adults with diabetes.    11/04/2023 02:24 PM 6.2 (H) <5.7 % Final    Comment:    For someone without known diabetes, a hemoglobin  A1c value between 5.7% and 6.4% is consistent with prediabetes and should be confirmed with a   follow-up test. . For someone with known diabetes,  a value <7% indicates that their diabetes is well controlled. A1c targets should be individualized based on duration of diabetes, age, comorbid conditions, and other considerations. . This assay result is consistent with an increased risk of diabetes. . Currently, no consensus exists regarding use of hemoglobin A1c for diagnosis of diabetes for children. .     CBG: Recent Labs  Lab 01/14/24 1518 01/14/24 2011 01/14/24 2313 01/15/24 0313 01/15/24 0756  GLUCAP 283* 183* 263* 236* 247*    Review of Systems:     Past Medical History:  He,  has a past medical history of Diabetes mellitus without complication (HCC) and Hypertension.   Surgical History:  No past surgical history on file.   Social History:   reports that he has never smoked. He has never used smokeless tobacco. He reports current alcohol use. He reports that he does not use drugs.   Family History:  His family history includes Alcohol abuse in his father; Depression in his father; Drug abuse in his father; Schizophrenia in his father; Thyroid  disease in his mother. There is no history of Colon cancer or Stomach cancer.   Allergies Allergies  Allergen Reactions   Mushroom Extract Complex (Obsolete)     Unknown reaction     Home Medications  Prior to Admission medications   Medication Sig Start Date End Date Taking? Authorizing Provider  Continuous Glucose Sensor (DEXCOM G7 SENSOR) MISC Every 10 days 06/30/23  Yes Pender, Julie F, FNP  glipiZIDE  (GLUCOTROL ) 10 MG tablet Take 1 tablet (10 mg total) by mouth 2 (two) times daily before a meal. 11/04/23  Yes Gareth Mliss FALCON, FNP  insulin  glargine, 2 Unit Dial, (TOUJEO  MAX SOLOSTAR) 300 UNIT/ML Solostar Pen Inject 16 Units into the skin daily at 6 (six) AM. 11/04/23  Yes Gareth Mliss FALCON, FNP  lisinopril  (ZESTRIL ) 5 MG tablet Take 1 tablet (5 mg total) by mouth daily. 11/04/23  Yes Gareth Mliss FALCON, FNP   saxagliptin  HCl (ONGLYZA) 5 MG TABS tablet TAKE 1 TABLET BY MOUTH DAILY 10/19/23  Yes Pender, Julie F, FNP  BD PEN NEEDLE MICRO ULTRAFINE 32G X 6 MM MISC 1 EACH BY DOES NOT APPLY ROUTE DAILY AT 6 (SIX) AM. 08/05/23   Gareth Mliss FALCON, FNP  Glucagon , rDNA, (GLUCAGON  EMERGENCY) 1 MG KIT Inject 1 mg into the muscle as needed (hypoglycemia). 10/06/23   Pender, Julie F, FNP     Hortensia Duffin, DO

## 2024-01-16 ENCOUNTER — Inpatient Hospital Stay (HOSPITAL_COMMUNITY)

## 2024-01-16 DIAGNOSIS — R569 Unspecified convulsions: Secondary | ICD-10-CM | POA: Diagnosis not present

## 2024-01-16 DEATH — deceased

## 2024-02-11 ENCOUNTER — Other Ambulatory Visit: Payer: Self-pay | Admitting: Nurse Practitioner

## 2024-02-11 DIAGNOSIS — E1165 Type 2 diabetes mellitus with hyperglycemia: Secondary | ICD-10-CM

## 2024-02-15 NOTE — Procedures (Signed)
 Patient Name: Thomas Stephenson  MRN: [id number]  Epilepsy Attending: Arlin MALVA Krebs  Referring Physician/Provider: Jude Harden GAILS, MD  Duration: 01/15/2024 1125 to 01-29-24 0900   Patient history: 43yo m s/p cardiac arrest. EEG to evaluate for seizure   Level of alertness: comatose   AEDs during EEG study:  VPA, LEV   Technical aspects: This EEG study was done with scalp electrodes positioned according to the 10-20 International system of electrode placement. Electrical activity was reviewed with band pass filter of 1-70Hz , sensitivity of 7 uV/mm, display speed of 62mm/sec with a 60Hz  notched filter applied as appropriate. EEG data were recorded continuously and digitally stored.  Video monitoring was available and reviewed as appropriate.   Description: EEG initially showed generalized periodic epileptiform discharges at 0.25-1Hz  as well as continuous generalized polymorphic mixed frequencies with predominantly 3 to 6 Hz theta-delta slowing admixed with 13-15 Hz beta activity. Gradually EEG worsened and showed generalized suppression. At around 0851 on Jan 29, 2024 , EKG showed bradycardia and eventually went into asystole. Hyperventilation and photic stimulation were not performed.      ABNORMALITY - Periodic epileptiform discharges, generalized  - Continuous slow, generalized   IMPRESSION: This study initially showed evidence of epileptogenicity with generalized onset as well as severe diffuse encephalopathy. Gradually EEG worsened and was suggestive of profound diffuse encephalopathy. No definite seizures were noted.  At around 0851 on 2024-01-29 , EKG showed bradycardia and eventually went into asystole.      Danee Soller O Mayson Mcneish

## 2024-02-15 NOTE — Progress Notes (Signed)
 LTM EEG disconnected. Patient passed away prior to tech arriving so the leads had already been removed. EEG equipment out of the room.

## 2024-02-15 NOTE — Progress Notes (Signed)
 Pt extubated to comfort. Per MD

## 2024-02-15 NOTE — Plan of Care (Signed)
  Problem: Activity: Goal: Ability to tolerate increased activity will improve Outcome: Not Applicable   Problem: Respiratory: Goal: Ability to maintain a clear airway and adequate ventilation will improve Outcome: Not Applicable   Problem: Role Relationship: Goal: Method of communication will improve Outcome: Not Applicable   Problem: Education: Goal: Ability to describe self-care measures that may prevent or decrease complications (Diabetes Survival Skills Education) will improve Outcome: Not Applicable Goal: Individualized Educational Video(s) Outcome: Not Applicable   Problem: Fluid Volume: Goal: Ability to maintain a balanced intake and output will improve Outcome: Not Applicable

## 2024-02-15 NOTE — Death Summary Note (Signed)
 DEATH SUMMARY   Patient Details  Name: Thomas Stephenson MRN: [id number] DOB: [birth year]  Admission/Discharge Information   Admit Date:  01-31-2024  Date of Death: Date of Death: February 05, 2024  Time of Death: Time of Death: 0912  Length of Stay: 5  Referring Physician: Gareth Mliss FALCON, FNP   Reason(s) for Hospitalization  Cardiac arrest  Diagnoses  Preliminary cause of death:  Secondary Diagnoses (including complications and co-morbidities):  Principal Problem:   Cardiac arrest [facility]) Active Problems:   Acute respiratory failure with hypoxia (HCC)   Anoxic encephalopathy (HCC)   On mechanically assisted ventilation (HCC)   Hypocalcemia   Pressure injury of skin   Hypernatremia Shock liver DM, uncontrolled hyperglycemia. A1c 6.1 Shock liver, stable Anemia Possible NSTEMI causing cardiac arrest  Brief Hospital Course (including significant findings, care, treatment, and services provided and events leading to death)  Thomas Stephenson is a 43 y.o. year old male who presented after sudden collapse, cardiac arrest and bystander initiated cpr. Pt has unknown overall down time, when EMS arrived they performed acls x23mins with rosc. All history is obtained from chart review as pt is intubated and unresponsive.  He is currently on 4mcg epi. CTH/neck pending. EKG with rbbb (no old comparisons) cardiology consulted and will see, no STMI activation but would like heparin infusion should cth result allow.   Following admission he had myoclonus that was suppressed with propofol. Despite TTM and supportive measures, he failed to regain consciousness. Brain MRI was consistent with diffuse anoxic brain injury. His family elected to withdraw aggressive life support. He passed away with family at bedside.     Pertinent Labs and Studies  Significant Diagnostic Studies MR BRAIN WO CONTRAST Result Date: 01/15/2024 CLINICAL DATA:  43 year old male with history of cardiac arrest, evaluate for anoxic brain  damage. EXAM: MRI HEAD WITHOUT CONTRAST TECHNIQUE: Multiplanar, multiecho pulse sequences of the brain and surrounding structures were obtained without intravenous contrast. COMPARISON:  Comparison made with CT from 31-Jan-2024. FINDINGS: Brain: Cerebral volume within normal limits. There is diffuse symmetric abnormal restricted diffusion involving the cortical gray matter of both cerebral hemispheres. Changes are most pronounced at the perirolandic regions and posterior bilateral parieto-occipital regions. Or mild involvement of the bilateral basal ganglia including the caudate and lentiform nuclei. Relative sparing of the brainstem and cerebellum. Associated gyral swelling with T2/FLAIR signal abnormality in the areas affected. Findings compatible with widespread anoxic brain damage. No midline shift or brain herniation. No evidence for acute or subacute vascular infarct. No acute or chronic intracranial blood products. No mass lesion or hydrocephalus. No extra-axial fluid collection. Pituitary gland and suprasellar region within normal limits. Vascular: Major intracranial vascular flow voids are maintained. Skull and upper cervical spine: Craniocervical junction with normal limits. Bone marrow signal intensity normal. Diffuse soft tissue swelling noted about the posterior scalp. Sinuses/Orbits: Globes orbital soft tissues demonstrate no acute finding. Scattered mucosal thickening present throughout the paranasal sinuses. Trace bilateral mastoid effusions. Patient is intubated. Other: None. IMPRESSION: Diffuse symmetric abnormal restricted diffusion involving the cortical gray matter of both cerebral hemispheres, with relative sparing of the brainstem and cerebellum. Findings compatible with anoxic brain injury. Electronically Signed   By: Morene Hoard M.D.   On: 01/15/2024 18:45   Overnight EEG with video Result Date: 01/13/2024 Shelton Arlin KIDD, MD     01/14/2024  9:43 AM Patient Name: Thomas Stephenson  MRN: [id number] Epilepsy Attending: Arlin KIDD Shelton Referring Physician/Provider: Jude Harden GAILS, MD Duration: 01/12/2024 1125 to 01/13/2024 1125 Patient history:  43yo m s/p cardiac arrest. EEG to evaluate for seizure Level of alertness: comatose AEDs during EEG study: Propofol, VPA, LEV Technical aspects: This EEG study was done with scalp electrodes positioned according to the 10-20 International system of electrode placement. Electrical activity was reviewed with band pass filter of 1-70Hz , sensitivity of 7 uV/mm, display speed of 94mm/sec with a 60Hz  notched filter applied as appropriate. EEG data were recorded continuously and digitally stored.  Video monitoring was available and reviewed as appropriate. Description: At the beginning of the study, EEG showed burst suppression pattern with generalized highly epileptiform bursts lasting 1 to 3 seconds alternating with 2 to 4 seconds of generalized EEG suppression.  Gradually as medications were adjusted, the morphology of burst improved to continuous generalized 3 to 6 Hz theta-delta slowing.  Sedation was held on 01/13/2024 and gradually after around 0815, EEG worsened and showed generalized periodic epileptiform discharges at 1.5 to 2.5Hz . Hyperventilation and photic stimulation were not performed.   ABNORMALITY - Burst suppression with highly epileptiform burst, generalized - Continuous slow, generalized - Periodic epileptiform discharges, generalized IMPRESSION: At the beginning of the study, EEG showed evidence of epileptogenicity with generalized onset. This EEG pattern was on the ictal-interictal continuum. As medications were adjusted, EEG improved and was suggestive of diffuse cerebral dysfunction (encephalopathy).  Sedation was held on 01/04/2024 and gradually after around 0815, EEG worsened and was again suggestive of epileptogenicity with generalized onset.  This EEG pattern was again on ictal-interictal continuum.  Given the frequency of discharges,  high suspicion for ictal nature. Dr. Arora was notified. Arlin MALVA Krebs   ECHOCARDIOGRAM COMPLETE Result Date: 01/12/2024    ECHOCARDIOGRAM REPORT   Patient Name:   Thomas Stephenson Date of Exam: 01/12/2024 Medical Rec #:  [id number]   Height:       69.0 in Accession #:    [id number]  Weight:       160.5 lb Date of Birth:  04-May-1980   BSA:          1.882 m Patient Age:    43 years    BP:           114/72 mmHg Patient Gender: M           HR:           95 bpm. Exam Location:  Inpatient Procedure: 2D Echo (Both Spectral and Color Flow Doppler were utilized during            procedure). Indications:    Cardiac Arrest  History:        Patient has no prior history of Echocardiogram examinations.                 Signs/Symptoms:Cardiac Arrest.  Sonographer:    Norleen Amour Referring Phys: JESSICA MARSHALL IMPRESSIONS  1. Left ventricular ejection fraction, by estimation, is 55 to 60%. The left ventricle has normal function. The left ventricle has no regional wall motion abnormalities. Left ventricular diastolic parameters were normal.  2. Right ventricular systolic function is normal. The right ventricular size is normal.  3. The mitral valve is normal in structure. No evidence of mitral valve regurgitation. No evidence of mitral stenosis.  4. The aortic valve was not well visualized. Aortic valve regurgitation is not visualized.  5. The inferior vena cava is normal in size with greater than 50% respiratory variability, suggesting right atrial pressure of 3 mmHg. Comparison(s): No prior Echocardiogram. FINDINGS  Left Ventricle: Left ventricular ejection fraction, by estimation, is 55 to  60%. The left ventricle has normal function. The left ventricle has no regional wall motion abnormalities. The left ventricular internal cavity size was normal in size. There is  no left ventricular hypertrophy. Left ventricular diastolic parameters were normal. Right Ventricle: The right ventricular size is normal. No increase in right  ventricular wall thickness. Right ventricular systolic function is normal. Left Atrium: Left atrial size was normal in size. Right Atrium: Right atrial size was normal in size. Pericardium: There is no evidence of pericardial effusion. Mitral Valve: The mitral valve is normal in structure. No evidence of mitral valve regurgitation. No evidence of mitral valve stenosis. Tricuspid Valve: The tricuspid valve is not well visualized. Tricuspid valve regurgitation is not demonstrated. No evidence of tricuspid stenosis. Aortic Valve: The aortic valve was not well visualized. Aortic valve regurgitation is not visualized. Aortic valve mean gradient measures 5.0 mmHg. Aortic valve peak gradient measures 7.5 mmHg. Aortic valve area, by VTI measures 1.94 cm. Pulmonic Valve: The pulmonic valve was normal in structure. Pulmonic valve regurgitation is not visualized. No evidence of pulmonic stenosis. Aorta: The aortic root and ascending aorta are structurally normal, with no evidence of dilitation. Venous: The inferior vena cava is normal in size with greater than 50% respiratory variability, suggesting right atrial pressure of 3 mmHg. IAS/Shunts: The atrial septum is grossly normal.  LEFT VENTRICLE PLAX 2D LVIDd:         4.00 cm     Diastology LVIDs:         2.90 cm     LV e' medial:    7.83 cm/s LV PW:         0.80 cm     LV E/e' medial:  8.1 LV IVS:        0.60 cm     LV e' lateral:   9.79 cm/s LVOT diam:     1.80 cm     LV E/e' lateral: 6.5 LV SV:         41 LV SV Index:   22 LVOT Area:     2.54 cm  LV Volumes (MOD) LV vol d, MOD A2C: 38.6 ml LV vol d, MOD A4C: 38.8 ml LV vol s, MOD A2C: 15.7 ml LV vol s, MOD A4C: 17.9 ml LV SV MOD A2C:     22.9 ml LV SV MOD A4C:     38.8 ml LV SV MOD BP:      22.1 ml RIGHT VENTRICLE             IVC RV Basal diam:  2.70 cm     IVC diam: 1.70 cm RV S prime:     12.20 cm/s TAPSE (M-mode): 1.9 cm      PULMONARY VEINS                             Diastolic Velocity: 31.80 cm/s                              S/D Velocity:       1.20                             Systolic Velocity:  37.50 cm/s LEFT ATRIUM           Index        RIGHT ATRIUM  Index LA diam:      2.50 cm 1.33 cm/m   RA Area:     8.74 cm LA Vol (A2C): 10.1 ml 5.37 ml/m   RA Volume:   18.70 ml 9.94 ml/m LA Vol (A4C): 20.1 ml 10.68 ml/m  AORTIC VALVE                     PULMONIC VALVE AV Area (Vmax):    1.81 cm      PV Vmax:       1.15 m/s AV Area (Vmean):   1.63 cm      PV Peak grad:  5.3 mmHg AV Area (VTI):     1.94 cm AV Vmax:           137.00 cm/s AV Vmean:          100.000 cm/s AV VTI:            0.211 m AV Peak Grad:      7.5 mmHg AV Mean Grad:      5.0 mmHg LVOT Vmax:         97.40 cm/s LVOT Vmean:        63.900 cm/s LVOT VTI:          0.161 m LVOT/AV VTI ratio: 0.76  AORTA Ao Root diam: 2.50 cm Ao Asc diam:  3.50 cm MITRAL VALVE MV Area (PHT): 4.26 cm    SHUNTS MV Decel Time: 178 msec    Systemic VTI:  0.16 m MV E velocity: 63.40 cm/s  Systemic Diam: 1.80 cm MV A velocity: 76.50 cm/s MV E/A ratio:  0.83 Stanly Leavens MD Electronically signed by Stanly Leavens MD Signature Date/Time: 01/12/2024/9:31:46 AM    Final    CT Head Wo Contrast Result Date: 01/11/2024 CLINICAL DATA:  Unresponsive EXAM: CT HEAD WITHOUT CONTRAST CT CERVICAL SPINE WITHOUT CONTRAST TECHNIQUE: Multidetector CT imaging of the head and cervical spine was performed following the standard protocol without intravenous contrast. Multiplanar CT image reconstructions of the cervical spine were also generated. RADIATION DOSE REDUCTION: This exam was performed according to the departmental dose-optimization program which includes automated exposure control, adjustment of the mA and/or kV according to patient size and/or use of iterative reconstruction technique. COMPARISON:  None Available. FINDINGS: CT HEAD FINDINGS Brain: No acute territorial infarction, hemorrhage, or intracranial mass. The ventricles are nonenlarged. Vascular: No hyperdense  vessels.  No unexpected calcification Skull: Normal. Negative for fracture or focal lesion. Sinuses/Orbits: Mild mucosal thickening in the sinuses. Fluid and or debris in the posterior nasopharynx Other: None CT CERVICAL SPINE FINDINGS Alignment: No subluxation.  Facet alignment within normal limits Skull base and vertebrae: Slightly limited by motion degradation. No obvious fracture Soft tissues and spinal canal: Patent spinal canal. Fluid within the nasal and oropharynx. Disc levels: Relatively patent disc spaces. Minimal narrowing at C5-C6. No high-grade bony canal stenosis. Upper chest: Negative. Other: None IMPRESSION: 1. Negative non contrasted CT appearance of the brain. 2. Cervical CT slightly limited by motion degradation. No definite acute osseous abnormality of the cervical spine. Electronically Signed   By: Luke Bun M.D.   On: 01/11/2024 22:29   CT Cervical Spine Wo Contrast Result Date: 01/11/2024 CLINICAL DATA:  Unresponsive EXAM: CT HEAD WITHOUT CONTRAST CT CERVICAL SPINE WITHOUT CONTRAST TECHNIQUE: Multidetector CT imaging of the head and cervical spine was performed following the standard protocol without intravenous contrast. Multiplanar CT image reconstructions of the cervical spine were also generated. RADIATION DOSE REDUCTION: This exam was  performed according to the departmental dose-optimization program which includes automated exposure control, adjustment of the mA and/or kV according to patient size and/or use of iterative reconstruction technique. COMPARISON:  None Available. FINDINGS: CT HEAD FINDINGS Brain: No acute territorial infarction, hemorrhage, or intracranial mass. The ventricles are nonenlarged. Vascular: No hyperdense vessels.  No unexpected calcification Skull: Normal. Negative for fracture or focal lesion. Sinuses/Orbits: Mild mucosal thickening in the sinuses. Fluid and or debris in the posterior nasopharynx Other: None CT CERVICAL SPINE FINDINGS Alignment: No  subluxation.  Facet alignment within normal limits Skull base and vertebrae: Slightly limited by motion degradation. No obvious fracture Soft tissues and spinal canal: Patent spinal canal. Fluid within the nasal and oropharynx. Disc levels: Relatively patent disc spaces. Minimal narrowing at C5-C6. No high-grade bony canal stenosis. Upper chest: Negative. Other: None IMPRESSION: 1. Negative non contrasted CT appearance of the brain. 2. Cervical CT slightly limited by motion degradation. No definite acute osseous abnormality of the cervical spine. Electronically Signed   By: Luke Bun M.D.   On: 01/11/2024 22:29   DG Abdomen 1 View Result Date: 01/11/2024 CLINICAL DATA:  OG tube placement EXAM: ABDOMEN - 1 VIEW COMPARISON:  None Available. FINDINGS: Enteric tube tip and side port overlie the stomach. Moderate air distension of the stomach. IMPRESSION: Enteric tube tip and side port overlie the stomach. Electronically Signed   By: Luke Bun M.D.   On: 01/11/2024 20:33   DG Chest Portable 1 View Result Date: 01/11/2024 CLINICAL DATA:  Found down at work EXAM: PORTABLE CHEST 1 VIEW COMPARISON:  None Available. FINDINGS: Endotracheal tube tip about 5.1 cm superior to the carina. Enteric tube tip below the diaphragm but incompletely assessed. No acute airspace disease or effusion. Normal cardiac size. No pneumothorax IMPRESSION: Endotracheal tube tip about 5.1 cm superior to the carina. No acute airspace disease. Electronically Signed   By: Luke Bun M.D.   On: 01/11/2024 20:33    Microbiology Recent Results (from the past 240 hours)  MRSA Next Gen by PCR, Nasal     Status: None   Collection Time: 01/12/24  1:15 AM   Specimen: Nasal Mucosa; Nasal Swab  Result Value Ref Range Status   MRSA by PCR Next Gen NOT DETECTED NOT DETECTED Final    Comment: (NOTE) The GeneXpert MRSA Assay (FDA approved for NASAL specimens only), is one component of a comprehensive MRSA colonization  surveillance program. It is not intended to diagnose MRSA infection nor to guide or monitor treatment for MRSA infections. Test performance is not FDA approved in patients less than 58 years old. Performed at [facility], [address], [place], [place] [address]     Lab Basic Metabolic Panel: Recent Labs  Lab 01/12/24 0251 01/12/24 2332 01/13/24 1050 01/14/24 0244 01/15/24 0237  NA 136 139 140 144 148*  K 4.6 3.9 3.6 3.6 3.8  CL 103 105 111 110 112*  CO2 20* 21* 22 23 23   GLUCOSE 328* 194* 154* 201* 235*  BUN 20 13 12 17 20   CREATININE 1.16 0.83 0.79 0.93 0.94  CALCIUM 8.3* 8.3* 8.1* 8.9 8.6*  MG 1.7 2.5* 2.4 2.3 2.0  PHOS 2.3* 2.7 3.0 2.1* 3.4   Liver Function Tests: Recent Labs  Lab 01/11/24 1951 01/14/24 0244 01/15/24 0237  AST 237* 91* 95*  ALT 332* 105* 79*  ALKPHOS 53 35* 35*  BILITOT 0.9 0.7 0.7  PROT 6.0* 5.2* 5.4*  ALBUMIN 3.7 2.7* 2.8*   No results  for input(s): LIPASE, AMYLASE in the last 168 hours. No results for input(s): AMMONIA in the last 168 hours. CBC: Recent Labs  Lab 01/11/24 1951 01/11/24 1953 01/12/24 0251 01/12/24 1105 01/12/24 2349 01/14/24 0244 01/15/24 0237  WBC 10.5  --  16.7* 13.8* 14.3* 9.5 7.9  NEUTROABS 7.1  --   --   --   --   --   --   HGB 15.3  15.6   < > 15.1 13.4 15.3 12.5* 12.5*  HCT 45.3  46.0   < > 43.1 40.3 45.1 36.5* 37.9*  MCV 87.6  --  85.7 87.6 85.9 87.1 89.4  PLT 239  --  232 186 170 175 160   < > = values in this interval not displayed.   Cardiac Enzymes: No results for input(s): CKTOTAL, CKMB, CKMBINDEX, TROPONINI in the last 168 hours. Sepsis Labs: Recent Labs  Lab 01/11/24 2340 01/12/24 0202 01/12/24 0251 01/12/24 1105 01/12/24 2349 01/13/24 1821 01/14/24 0244 01/15/24 0237  WBC  --   --    < > 13.8* 14.3*  --  9.5 7.9  LATICACIDVEN 6.2* 6.0*  --   --   --  2.5*  --   --    < > = values in this interval not displayed.    Procedures/Operations  EEGs   Leita SHAUNNA Gaskins February 03, 2024, 10:03 AM

## 2024-02-15 DEATH — deceased

## 2024-03-07 ENCOUNTER — Ambulatory Visit: Admitting: Nurse Practitioner
